# Patient Record
Sex: Female | Born: 1970 | Race: Black or African American | Hispanic: No | Marital: Married | Smoking: Never smoker
Health system: Southern US, Community
[De-identification: ages and names within clinical notes are randomized; demographics above are authoritative.]

## PROBLEM LIST (undated history)

## (undated) DIAGNOSIS — D0510 Intraductal carcinoma in situ of unspecified breast: Secondary | ICD-10-CM

## (undated) DIAGNOSIS — H269 Unspecified cataract: Secondary | ICD-10-CM

## (undated) DIAGNOSIS — D649 Anemia, unspecified: Secondary | ICD-10-CM

## (undated) DIAGNOSIS — Z8489 Family history of other specified conditions: Secondary | ICD-10-CM

## (undated) DIAGNOSIS — Z961 Presence of intraocular lens: Secondary | ICD-10-CM

## (undated) DIAGNOSIS — Q150 Congenital glaucoma: Secondary | ICD-10-CM

## (undated) HISTORY — DX: Unspecified cataract: H26.9

## (undated) HISTORY — DX: Congenital glaucoma: Q15.0

## (undated) HISTORY — PX: EYE SURGERY: SHX253

## (undated) HISTORY — DX: Intraductal carcinoma in situ of unspecified breast: D05.10

## (undated) HISTORY — DX: Presence of intraocular lens: Z96.1

---

## 2005-05-30 ENCOUNTER — Encounter: Admission: RE | Admit: 2005-05-30 | Discharge: 2005-05-30 | Payer: Self-pay | Admitting: Unknown Physician Specialty

## 2007-05-15 ENCOUNTER — Inpatient Hospital Stay: Payer: Self-pay | Admitting: Internal Medicine

## 2007-05-15 ENCOUNTER — Other Ambulatory Visit: Payer: Self-pay

## 2007-05-16 ENCOUNTER — Other Ambulatory Visit: Payer: Self-pay

## 2008-09-30 ENCOUNTER — Emergency Department: Payer: Self-pay | Admitting: Internal Medicine

## 2014-03-20 ENCOUNTER — Other Ambulatory Visit: Payer: Self-pay | Admitting: Family Medicine

## 2014-03-20 DIAGNOSIS — N939 Abnormal uterine and vaginal bleeding, unspecified: Secondary | ICD-10-CM

## 2014-03-20 DIAGNOSIS — D509 Iron deficiency anemia, unspecified: Secondary | ICD-10-CM

## 2014-03-20 DIAGNOSIS — Z86018 Personal history of other benign neoplasm: Secondary | ICD-10-CM

## 2014-03-24 ENCOUNTER — Ambulatory Visit
Admission: RE | Admit: 2014-03-24 | Discharge: 2014-03-24 | Disposition: A | Discharge status: Home / Self Care | Payer: Managed Care, Other (non HMO) | Source: Ambulatory Visit | Attending: Family Medicine | Admitting: Family Medicine

## 2014-03-24 DIAGNOSIS — Z86018 Personal history of other benign neoplasm: Secondary | ICD-10-CM

## 2014-03-24 DIAGNOSIS — N939 Abnormal uterine and vaginal bleeding, unspecified: Secondary | ICD-10-CM

## 2014-03-24 DIAGNOSIS — D509 Iron deficiency anemia, unspecified: Secondary | ICD-10-CM

## 2015-09-28 ENCOUNTER — Other Ambulatory Visit: Payer: Self-pay | Admitting: Family Medicine

## 2015-09-28 DIAGNOSIS — D259 Leiomyoma of uterus, unspecified: Secondary | ICD-10-CM

## 2015-10-01 ENCOUNTER — Other Ambulatory Visit: Payer: Managed Care, Other (non HMO)

## 2015-10-11 ENCOUNTER — Ambulatory Visit
Admission: RE | Admit: 2015-10-11 | Discharge: 2015-10-11 | Disposition: A | Discharge status: Home / Self Care | Payer: Managed Care, Other (non HMO) | Source: Ambulatory Visit | Attending: Family Medicine | Admitting: Family Medicine

## 2015-10-11 DIAGNOSIS — D259 Leiomyoma of uterus, unspecified: Secondary | ICD-10-CM

## 2016-01-03 ENCOUNTER — Ambulatory Visit
Admission: RE | Admit: 2016-01-03 | Payer: Managed Care, Other (non HMO) | Source: Ambulatory Visit | Admitting: Ophthalmology

## 2016-01-03 ENCOUNTER — Encounter: Admission: RE | Payer: Self-pay | Source: Ambulatory Visit

## 2016-01-03 SURGERY — PHOTOCOAGULATION, EYE, USING LASER
Anesthesia: Regional | Laterality: Right

## 2016-03-27 ENCOUNTER — Other Ambulatory Visit: Payer: Self-pay | Admitting: Family Medicine

## 2016-03-27 DIAGNOSIS — Z1231 Encounter for screening mammogram for malignant neoplasm of breast: Secondary | ICD-10-CM

## 2016-04-04 ENCOUNTER — Ambulatory Visit: Payer: Managed Care, Other (non HMO)

## 2016-04-21 ENCOUNTER — Ambulatory Visit
Admission: RE | Admit: 2016-04-21 | Discharge: 2016-04-21 | Disposition: A | Discharge status: Home / Self Care | Payer: Managed Care, Other (non HMO) | Source: Ambulatory Visit | Attending: Family Medicine | Admitting: Family Medicine

## 2016-04-21 DIAGNOSIS — Z1231 Encounter for screening mammogram for malignant neoplasm of breast: Secondary | ICD-10-CM

## 2016-06-05 ENCOUNTER — Encounter (HOSPITAL_COMMUNITY): Payer: Self-pay | Admitting: Emergency Medicine

## 2016-06-05 ENCOUNTER — Emergency Department (HOSPITAL_COMMUNITY)
Admission: EM | Admit: 2016-06-05 | Discharge: 2016-06-05 | Disposition: A | Discharge status: Home / Self Care | Payer: Managed Care, Other (non HMO) | Attending: Physician Assistant | Admitting: Physician Assistant

## 2016-06-05 ENCOUNTER — Emergency Department (HOSPITAL_COMMUNITY): Payer: Managed Care, Other (non HMO)

## 2016-06-05 DIAGNOSIS — R079 Chest pain, unspecified: Secondary | ICD-10-CM | POA: Diagnosis not present

## 2016-06-05 DIAGNOSIS — Z91040 Latex allergy status: Secondary | ICD-10-CM | POA: Insufficient documentation

## 2016-06-05 LAB — BASIC METABOLIC PANEL
Anion gap: 6 (ref 5–15)
BUN: 10 mg/dL (ref 6–20)
CO2: 26 mmol/L (ref 22–32)
Calcium: 9.5 mg/dL (ref 8.9–10.3)
Chloride: 106 mmol/L (ref 101–111)
Creatinine, Ser: 0.69 mg/dL (ref 0.44–1.00)
GFR calc Af Amer: >60 mL/min (ref >60)
GFR calc non Af Amer: >60 mL/min (ref >60)
Glucose, Bld: 86 mg/dL (ref 65–99)
Potassium: 3.9 mmol/L (ref 3.5–5.1)
Sodium: 138 mmol/L (ref 135–145)

## 2016-06-05 LAB — I-STAT TROPONIN, ED
Troponin i, poc: 0 ng/mL (ref 0.00–0.08)
Troponin i, poc: 0 ng/mL (ref 0.00–0.08)

## 2016-06-05 LAB — CBC
HCT: 39.9 % (ref 36.0–46.0)
Hemoglobin: 12.7 g/dL (ref 12.0–15.0)
MCH: 25.7 pg — ABNORMAL LOW (ref 26.0–34.0)
MCHC: 31.8 g/dL (ref 30.0–36.0)
MCV: 80.6 fL (ref 78.0–100.0)
Platelets: 284 10*3/uL (ref 150–400)
RBC: 4.95 MIL/uL (ref 3.87–5.11)
RDW: 15.6 % — ABNORMAL HIGH (ref 11.5–15.5)
WBC: 5.7 10*3/uL (ref 4.0–10.5)

## 2016-06-05 LAB — I-STAT BETA HCG BLOOD, ED (MC, WL, AP ONLY): I-stat hCG, quantitative: <5 m[IU]/mL (ref <5)

## 2016-06-05 MED ORDER — GI COCKTAIL ~~LOC~~
30.0000 mL | Freq: Once | ORAL | Status: AC
  Administered 2016-06-05: 30 mL via ORAL
  Filled 2016-06-05: qty 30

## 2016-06-05 MED ORDER — ASPIRIN 81 MG PO CHEW
324.0000 mg | CHEWABLE_TABLET | Freq: Once | ORAL | Status: AC
  Administered 2016-06-05: 324 mg via ORAL
  Filled 2016-06-05: qty 4

## 2016-06-05 NOTE — ED Notes (Signed)
Upon introducing self to pt EKG in process by Covil NT.

## 2016-06-05 NOTE — ED Triage Notes (Signed)
Pt c/o sudden onset R chest pain that radiates down R arm since this morning. Pt also c/o associated nausea with the pain. 7/10 pain at this time. Pt c/o intermittent SOB, denies dizziness. Denies leg swelling. Pt sts she has been cleaning a lot and thinks she may have overworked herself. Denies worsening of pain with movement.

## 2016-06-05 NOTE — Discharge Instructions (Signed)
Please follow up with her primary care physician. We did tests an x-ray which did not show any cause for your pain. It may due to the diet changes. Please pay close attention return with any changes.

## 2016-06-05 NOTE — ED Provider Notes (Signed)
[place] DEPT Provider Note   CSN: [id number] Arrival date & time: 06/05/16  1020     History   Chief Complaint Chief Complaint  Patient presents with  . Chest Pain  . Nausea    HPI Brandi Fox is a 45 y.o. female.  The history is provided by the patient.  Chest Pain   This is a new problem. The current episode started 1 to 2 hours ago. The problem occurs constantly. The problem has not changed since onset.The pain is associated with lifting, raising an arm and movement. The pain is present in the lateral region. The pain is at a severity of 3/10. The pain is mild. The quality of the pain is described as sharp. The pain radiates to the right shoulder and right arm. Associated symptoms include shortness of breath. Pertinent negatives include no abdominal pain, no dizziness, no fever and no weakness. She has tried nothing for the symptoms. The treatment provided no relief. There are no known risk factors.  Pertinent negatives for past medical history include no COPD, no hyperlipidemia, no hypertension and no PE.  Pertinent negatives for family medical history include: no aortic dissection, no PE and no PVD.  Procedure history is negative for cardiac catheterization and echocardiogram.       History reviewed. No pertinent past medical history.  There are no active problems to display for this patient.   Past Surgical History:  Procedure Laterality Date  . EYE SURGERY      OB History    No data available       Home Medications    Prior to Admission medications   Medication Sig Start Date End Date Taking? Authorizing Provider  brimonidine (ALPHAGAN) 0.2 % ophthalmic solution Place 1 drop into both eyes 2 (two) times daily.   Yes Historical Provider, MD  Chaste Tree (VITEX EXTRACT PO) Take 1 tablet by mouth daily.   Yes Historical Provider, MD  latanoprost (XALATAN) 0.005 % ophthalmic solution Place 1 drop into both eyes at bedtime.   Yes Historical Provider,  MD  Multiple Vitamin (MULTIVITAMIN WITH MINERALS) TABS tablet Take 1 tablet by mouth daily.   Yes Historical Provider, MD  timolol (TIMOPTIC) 0.5 % ophthalmic solution Place 1 drop into both eyes 2 (two) times daily.   Yes Historical Provider, MD    Family History No family history on file.  Social History Social History  Substance Use Topics  . Smoking status: Never Smoker  . Smokeless tobacco: Never Used  . Alcohol use No     Allergies   Amoxicillin; Latex; and Penicillins   Review of Systems Review of Systems  Constitutional: Negative for activity change, fatigue and fever.  Respiratory: Positive for shortness of breath.   Cardiovascular: Positive for chest pain.  Gastrointestinal: Negative for abdominal pain.  Neurological: Negative for dizziness and weakness.     Physical Exam Updated Vital Signs BP 117/70   Pulse 60   Temp 97.9 F (36.6 C) (Oral)   Resp 16   LMP 04/05/2016   SpO2 99%   Physical Exam  Constitutional: She is oriented to person, place, and time. She appears well-developed and well-nourished.  HENT:  Head: Normocephalic and atraumatic.  Eyes: Conjunctivae are normal. Right eye exhibits no discharge.  Neck: Neck supple.  Cardiovascular: Normal rate, regular rhythm and normal heart sounds.   No murmur heard. Pulmonary/Chest: Effort normal and breath sounds normal. She has no wheezes. She has no rales.  Abdominal: Soft. She exhibits no  distension. There is no tenderness.  Musculoskeletal: Normal range of motion. She exhibits no edema.  R arm pain  Neurological: She is oriented to person, place, and time. No cranial nerve deficit.  Skin: Skin is warm and dry. No rash noted. She is not diaphoretic.  Psychiatric: She has a normal mood and affect. Her behavior is normal.  Nursing note and vitals reviewed.    ED Treatments / Results  Labs (all labs ordered are listed, but only abnormal results are displayed) Labs Reviewed  CBC - Abnormal;  Notable for the following:       Result Value   MCH 25.7 (*)    RDW 15.6 (*)    All other components within normal limits  BASIC METABOLIC PANEL  I-STAT TROPOININ, ED  I-STAT BETA HCG BLOOD, ED ([place], [facility], [facility] ONLY)  I-STAT TROPOININ, ED  I-STAT TROPOININ, ED    EKG  EKG Interpretation  Date/Time:  Monday June 05 2016 10:36:45 EST Ventricular Rate:  60 PR Interval:    QRS Duration: 81 QT Interval:  409 QTC Calculation: 409 R Axis:   66 Text Interpretation:  Sinus rhythm Consider left atrial enlargement Anteroseptal infarct, old No significant change since last tracing Confirmed by Gerald Leitz ([id number]) on 06/05/2016 2:18:19 PM       Radiology Dg Chest 2 View  Result Date: 06/05/2016 CLINICAL DATA:  Chest pain.  Shortness of breath EXAM: CHEST  2 VIEW COMPARISON:  05/15/2007 . FINDINGS: Mediastinum and hilar structures normal. Low lung volumes with mild right base subsegmental atelectasis. No pleural effusion or pneumothorax. Heart size stable. No acute bony abnormality identified. IMPRESSION: Low lung volumes mild right base subsegmental atelectasis. Electronically Signed   By: Marcello Moores  Register   On: 06/05/2016 11:13    Procedures Procedures (including critical care time)  Medications Ordered in ED Medications  aspirin chewable tablet 324 mg (324 mg Oral Given 06/05/16 1055)  gi cocktail (Maalox,Lidocaine,Donnatal) (30 mLs Oral Given 06/05/16 1055)     Initial Impression / Assessment and Plan / ED Course  I have reviewed the triage vital signs and the nursing notes.  Pertinent labs & imaging results that were available during my care of the patient were reviewed by me and considered in my medical decision making (see chart for details).  Clinical Course     Patient is a 45 year old female presenting with chest pain radiating to the right arm. Patient was cleaning very heavily this weekend and feels like it may be secondary to that. However she developed mild nausea  and radiation to the right arm. She googled her symptoms and was concerned about cardiac ideology of chest pain. Patient does remark report mild shortness of breath, but she is says that she is worried that she may just be convincing herself she had this.  Patient has noted recent change in diet which she is worried might of caused her epigastric pain today.  Patient has normal vital signs. No history of hypertension hyperlipidemia or cardiac disease. No family history of early cardiac deaths.  We will do delta troponin.  2:18 PM Repeat trop negative.    Will have her follow up with PCP>  Final Clinical Impressions(s) / ED Diagnoses   Final diagnoses:  None    New Prescriptions New Prescriptions   No medications on file     Pernell Lenoir Julio Alm, MD 06/05/16 1418

## 2017-05-23 ENCOUNTER — Other Ambulatory Visit: Payer: Self-pay | Admitting: Family Medicine

## 2017-05-23 DIAGNOSIS — Z1231 Encounter for screening mammogram for malignant neoplasm of breast: Secondary | ICD-10-CM

## 2018-07-09 ENCOUNTER — Ambulatory Visit: Payer: Managed Care, Other (non HMO)

## 2018-07-10 ENCOUNTER — Ambulatory Visit
Admission: RE | Admit: 2018-07-10 | Discharge: 2018-07-10 | Disposition: A | Discharge status: Home / Self Care | Payer: Managed Care, Other (non HMO) | Source: Ambulatory Visit | Attending: Family Medicine | Admitting: Family Medicine

## 2018-07-10 DIAGNOSIS — Z1231 Encounter for screening mammogram for malignant neoplasm of breast: Secondary | ICD-10-CM

## 2018-08-20 IMAGING — MG DIGITAL SCREENING BILATERAL MAMMOGRAM WITH CAD
4 series · 4 of 4 positions shown · non-contrast
Comparison: None.

CLINICAL DATA: Screening.

EXAM:
DIGITAL SCREENING BILATERAL MAMMOGRAM WITH CAD

[L CC]
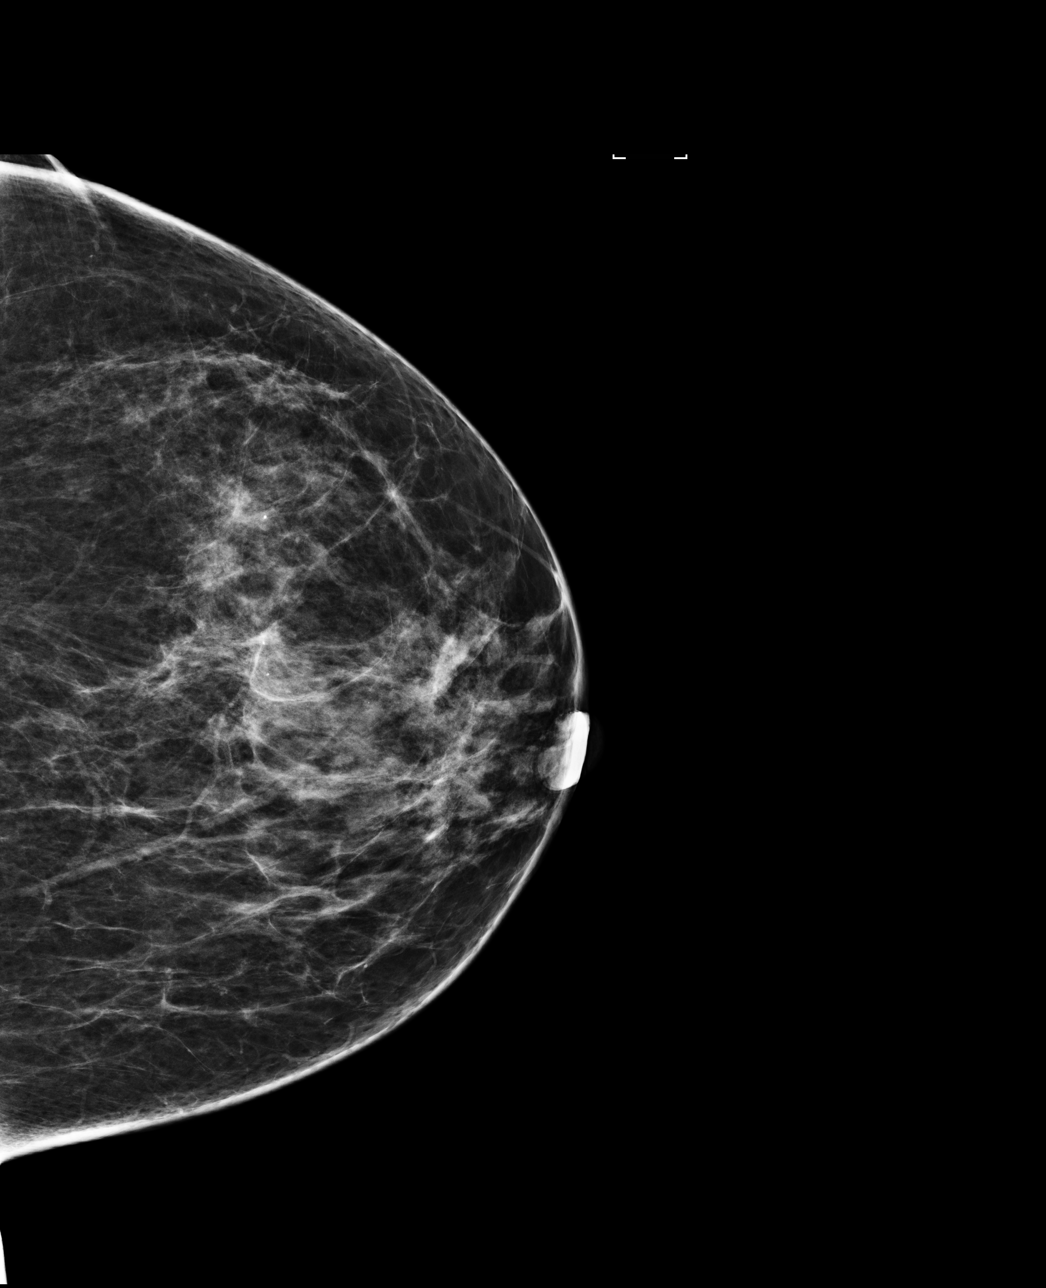

[L MLO]
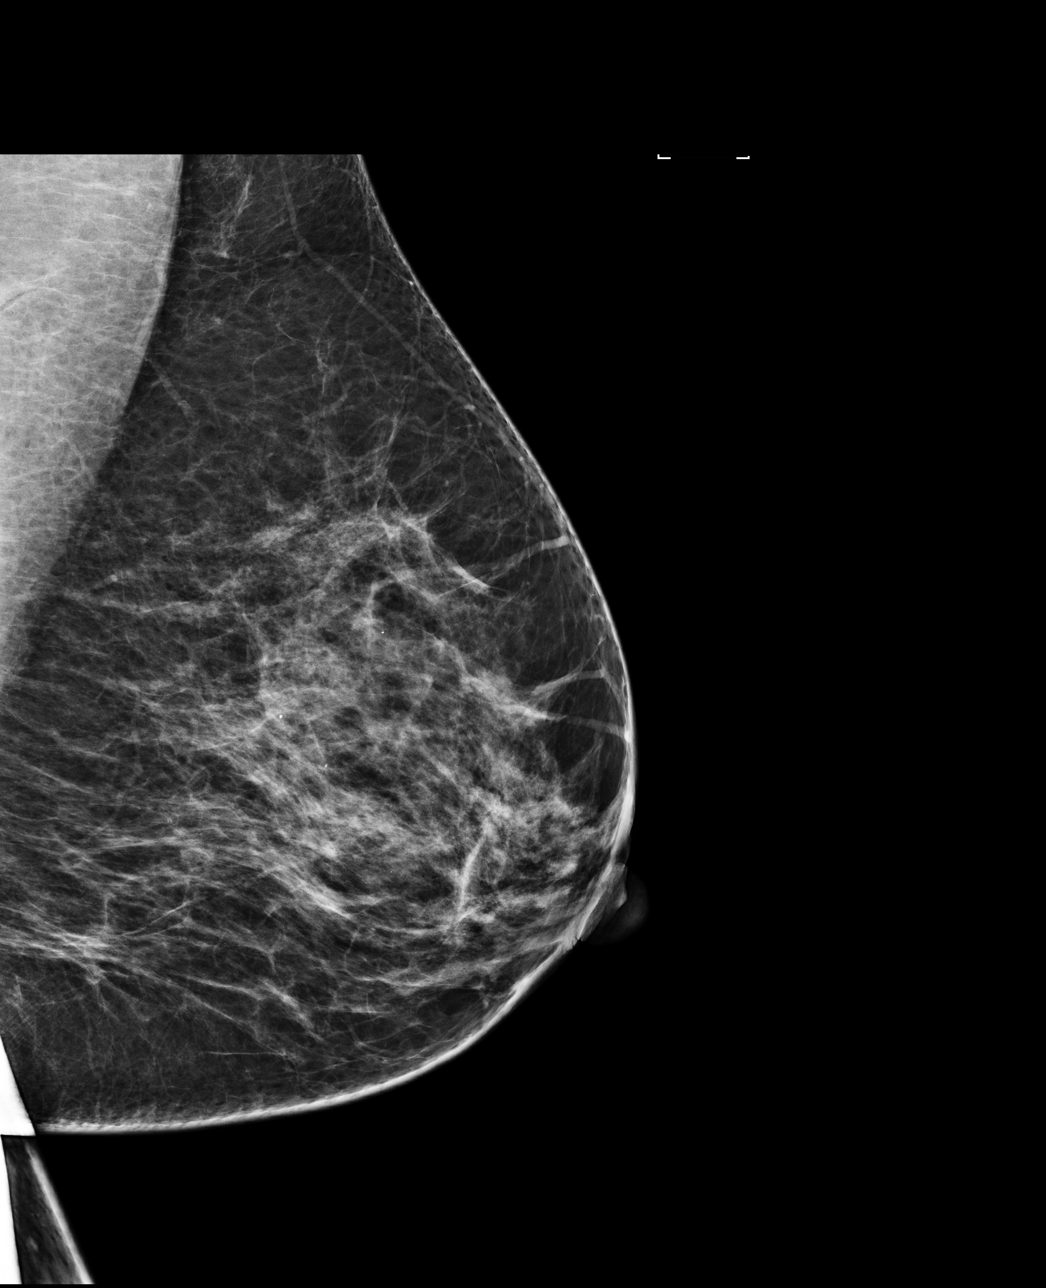

[R MLO]
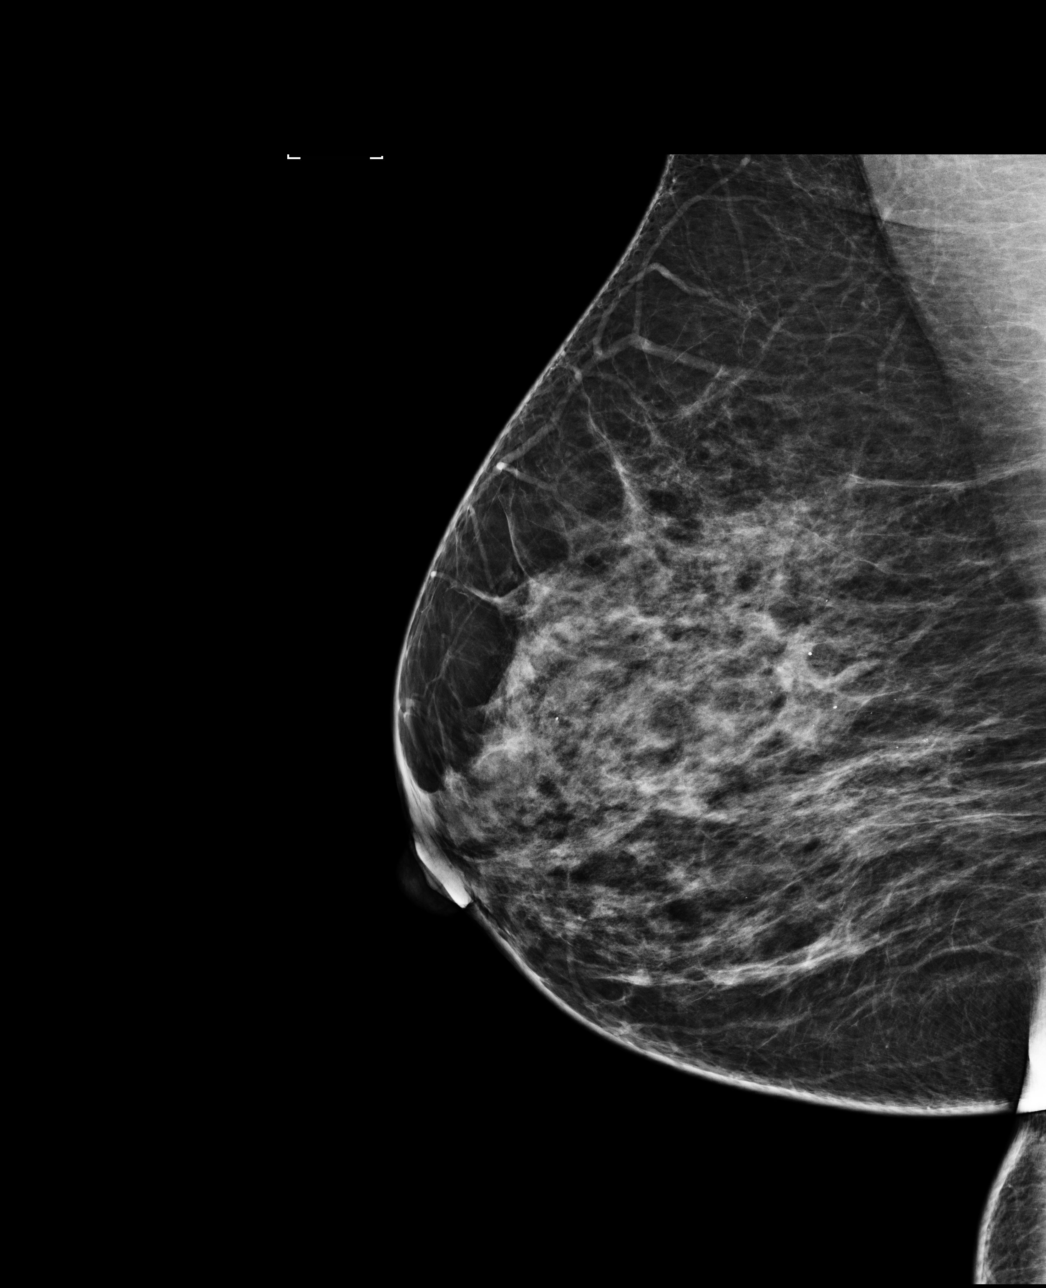

[R CC]
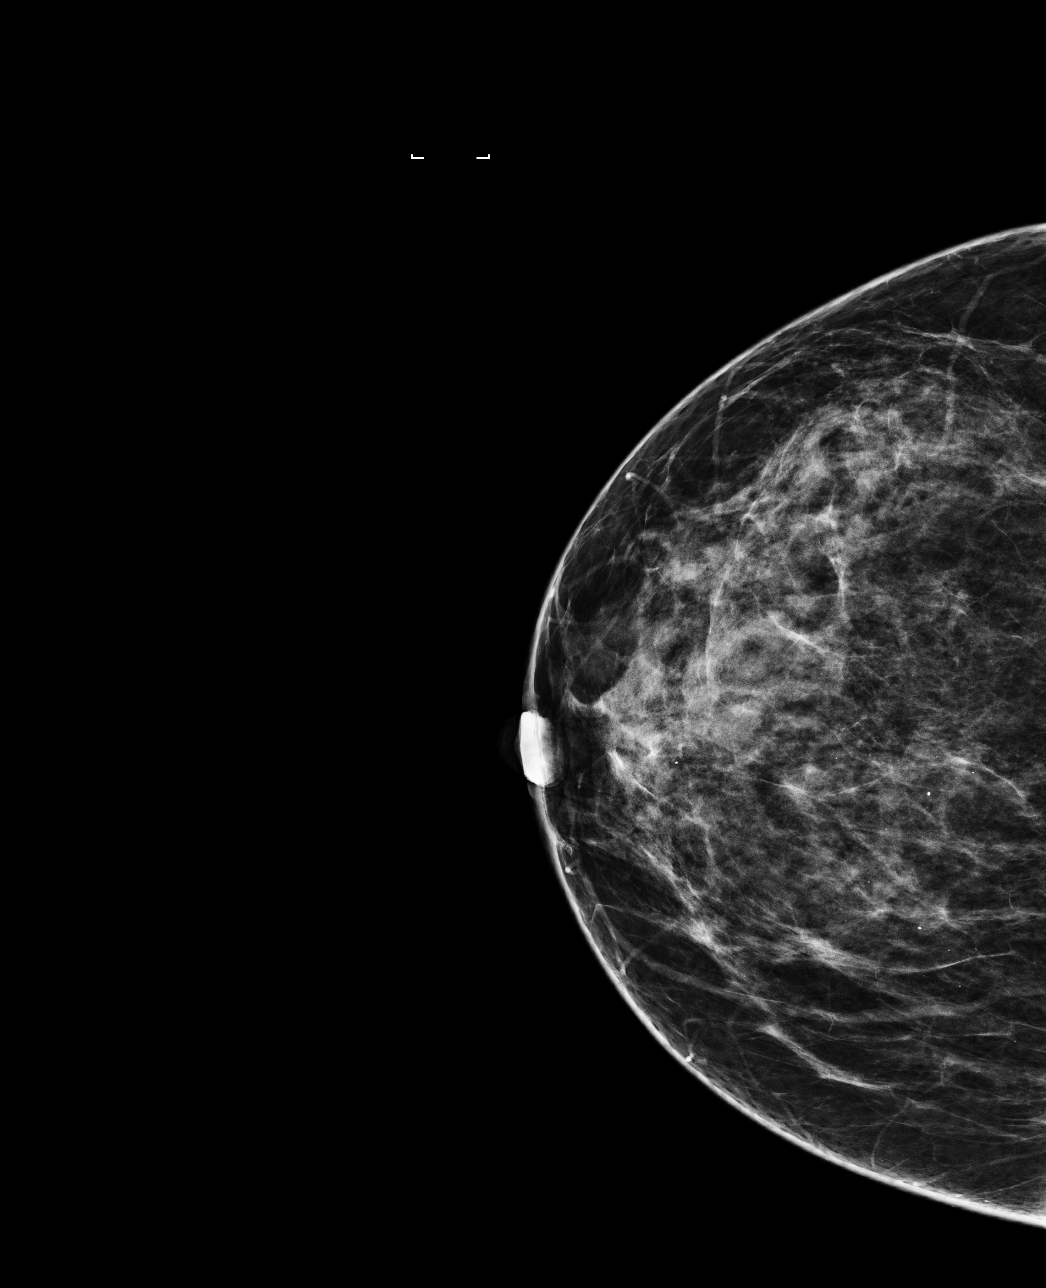

[4 of 4 positions shown; findings below may reference images not displayed]

ACR Breast Density Category c: The breast tissue is heterogeneously
dense, which may obscure small masses
FINDINGS: There are no findings suspicious for malignancy. Images were
processed with CAD.
IMPRESSION: No mammographic evidence of malignancy. A result letter of this
screening mammogram will be mailed directly to the patient.

RECOMMENDATION:
Screening mammogram in one year. (Code:[id number])

BI-RADS CATEGORY  1: Negative.

## 2019-08-14 ENCOUNTER — Other Ambulatory Visit: Payer: Managed Care, Other (non HMO)

## 2019-08-25 ENCOUNTER — Other Ambulatory Visit: Payer: Managed Care, Other (non HMO)

## 2019-09-01 ENCOUNTER — Encounter: Payer: Self-pay | Admitting: Obstetrics & Gynecology

## 2019-09-01 ENCOUNTER — Other Ambulatory Visit: Payer: Managed Care, Other (non HMO)

## 2019-09-05 ENCOUNTER — Encounter: Payer: Self-pay | Admitting: Obstetrics & Gynecology

## 2019-10-13 ENCOUNTER — Encounter: Payer: Self-pay | Admitting: Obstetrics & Gynecology

## 2020-05-12 ENCOUNTER — Other Ambulatory Visit: Payer: Self-pay | Admitting: Family Medicine

## 2020-05-12 DIAGNOSIS — Z1231 Encounter for screening mammogram for malignant neoplasm of breast: Secondary | ICD-10-CM

## 2020-07-08 NOTE — Telephone Encounter (Signed)
Please advise 

## 2020-07-09 NOTE — Telephone Encounter (Signed)
Pt calling; is requesting an u/s to be done before her appt c PH on the 14th so they can discuss size and location of the fibroids, next steps; is suffering greatly c the fibroids.  Is aware PH is out of the office.  [phone]  Adv pt she is a new pt; PH may need to see her first before ordering u/s; pt states he has seen her before; adv if not seen in three yrs or more she is considered a new pt.  Adv pt I will send msg to [facility]; if he doesn't get to msgs before Monday he will get to it Monday.  Pt very appreciative of call.

## 2020-07-12 NOTE — Telephone Encounter (Signed)
Patient is scheduled for 07/13/20 at 1:30

## 2020-07-13 ENCOUNTER — Other Ambulatory Visit: Payer: Self-pay | Admitting: Obstetrics & Gynecology

## 2020-07-13 ENCOUNTER — Other Ambulatory Visit (HOSPITAL_COMMUNITY)
Admission: RE | Admit: 2020-07-13 | Discharge: 2020-07-13 | Disposition: A | Discharge status: Home / Self Care | Payer: Managed Care, Other (non HMO) | Source: Ambulatory Visit | Attending: Obstetrics & Gynecology | Admitting: Obstetrics & Gynecology

## 2020-07-13 ENCOUNTER — Encounter: Payer: Self-pay | Admitting: Obstetrics & Gynecology

## 2020-07-13 ENCOUNTER — Ambulatory Visit (INDEPENDENT_AMBULATORY_CARE_PROVIDER_SITE_OTHER): Payer: Managed Care, Other (non HMO)

## 2020-07-13 ENCOUNTER — Other Ambulatory Visit: Payer: Self-pay

## 2020-07-13 ENCOUNTER — Ambulatory Visit: Payer: Managed Care, Other (non HMO) | Admitting: Obstetrics & Gynecology

## 2020-07-13 VITALS — BP 120/80 | Ht 63.0 in | Wt 199.0 lb

## 2020-07-13 DIAGNOSIS — D252 Subserosal leiomyoma of uterus: Secondary | ICD-10-CM | POA: Diagnosis not present

## 2020-07-13 DIAGNOSIS — D219 Benign neoplasm of connective and other soft tissue, unspecified: Secondary | ICD-10-CM

## 2020-07-13 DIAGNOSIS — N921 Excessive and frequent menstruation with irregular cycle: Secondary | ICD-10-CM

## 2020-07-13 DIAGNOSIS — Z124 Encounter for screening for malignant neoplasm of cervix: Secondary | ICD-10-CM

## 2020-07-13 DIAGNOSIS — D251 Intramural leiomyoma of uterus: Secondary | ICD-10-CM | POA: Diagnosis not present

## 2020-07-13 NOTE — Progress Notes (Signed)
Uterine Fibroids Patient is a 49 yo G62P1 AA F who presents with past history of uterine fibroids and concerns for worsening symptoms related to them.. Periods are irregular for the last 1-2 years but still occurring every 3-6 weeks, lasting 12 or more days. Dysmenorrhea:moderate, occurring throughout menses. Cyclic symptoms include bloating, pelvic pain and fatigue. No intermenstrual bleeding, spotting, or discharge.   PMHx: She  has no past medical history on file. Also,  has a past surgical history that includes Eye surgery., family history is not on file.,  reports that she has never smoked. She has never used smokeless tobacco. She reports that she does not drink alcohol and does not use drugs.  She has a current medication list which includes the following prescription(s): brimonidine, chaste tree, latanoprost, multivitamin with minerals, and timolol. Also, is allergic to amoxicillin, latex, and penicillins.  Review of Systems  Constitutional: Positive for malaise/fatigue. Negative for chills and fever.  HENT: Negative for congestion, sinus pain and sore throat.   Eyes: Negative for blurred vision and pain.  Respiratory: Negative for cough and wheezing.   Cardiovascular: Negative for chest pain and leg swelling.  Gastrointestinal: Positive for abdominal pain. Negative for constipation, diarrhea, heartburn, nausea and vomiting.  Genitourinary: Negative for dysuria, frequency, hematuria and urgency.  Musculoskeletal: Negative for back pain, joint pain, myalgias and neck pain.  Skin: Negative for itching and rash.  Neurological: Negative for dizziness, tremors and weakness.  Endo/Heme/Allergies: Does not bruise/bleed easily.  Psychiatric/Behavioral: Negative for depression. The patient is not nervous/anxious and does not have insomnia.     Objective: BP 120/80   Ht 5\' 3"  (1.6 m)   Wt 199 lb (90.3 kg)   BMI 35.25 kg/m  Physical Exam Constitutional:      General: She is not in acute  distress.    Appearance: She is well-developed.  Genitourinary:     Vagina normal.     No vaginal erythema or bleeding.      Right Adnexa: not tender and no mass present.    Left Adnexa: not tender and no mass present.    No cervical motion tenderness, discharge, polyp or nabothian cyst.     Uterus is enlarged and mobile.     Uterus is not tender.     Uterine mass present.    Uterus exam comments: 12 week size.     Uterus is midaxial and anteverted.     Pelvic exam was performed with patient supine.  HENT:     Head: Normocephalic and atraumatic.     Nose: Nose normal.  Abdominal:     General: There is no distension.     Palpations: Abdomen is soft.     Tenderness: There is no abdominal tenderness.  Musculoskeletal:        General: Normal range of motion.  Neurological:     Mental Status: She is alert and oriented to person, place, and time.     Cranial Nerves: No cranial nerve deficit.  Skin:    General: Skin is warm and dry.  Psychiatric:        Attention and Perception: Attention normal.        Mood and Affect: Mood and affect normal.        Speech: Speech normal.        Behavior: Behavior normal.        Thought Content: Thought content normal.        Judgment: Judgment normal.    [place] TODAY The uterus  is anteverted and measures 13.1 x 8.1 x 7.7 cm. Echo texture is heterogenous with evidence of focal masses. Within the uterus are multiple suspected fibroids measuring: Fibroid 1:41.3 x 37.5 x 37.1 mm intramural vs. less than 25% submucosal, anterior Fibroid 2:18.6 x 11.8 x 17.8 mm intramural posteiror Fibroid 3: 26.3 x 17.8 x 24.8 mm subserosal left, lower uterine segment Fibroid 4: 12.3 x 9.0 x 17.4 mm intramural posterior, lower uterine segment The Endometrium measures 10.4 mm.  This represents and increase from 2017 [place] sized fibroids  ASSESSMENT/PLAN:    Problem List Items Addressed This Visit      Genitourinary   Intramural and subserous leiomyoma of uterus -  Primary     Other   Menorrhagia with irregular cycle    Other Visit Diagnoses    Screening for cervical cancer       Relevant Orders   Cytology - PAP    Fibroid treatment such as [place], Lupron, Myomectomy, and Hysterectomy discussed in detail, with the pros and cons of each choice counseled.  No treatment as an option also discussed, as well as control of symptoms alone with hormone therapy. Information provided to the patient.  To consider her options for now No interested in fertility No s/sx menopause at this time.    Barnett Applebaum, MD, [facility], [facility] 07/13/2020  2:34 PM

## 2020-07-13 NOTE — Patient Instructions (Signed)
Total Laparoscopic Hysterectomy A total laparoscopic hysterectomy is a minimally invasive surgery to remove the uterus and cervix. The fallopian tubes and ovaries can also be removed (bilateral salpingo-oophorectomy) during this surgery, if necessary. This procedure may be done to treat problems such as:  Noncancerous growths in the uterus (uterine fibroids) that cause symptoms.  A condition that causes the lining of the uterus (endometrium) to grow in other areas (endometriosis).  Problems with pelvic support. This is caused by weakened muscles of the pelvis following vaginal childbirth or menopause.  Cancer of the cervix, ovaries, uterus, or endometrium.  Excessive (dysfunctional) uterine bleeding. This surgery is performed by inserting a thin, lighted tube (laparoscope) and surgical instruments into small incisions in the abdomen. The laparoscope sends images to a monitor. The images help the health care provider perform the procedure. After this procedure, you will no longer be able to have a baby, and you will no longer have a menstrual period. Tell a health care provider about:  Any allergies you have.  All medicines you are taking, including vitamins, herbs, eye drops, creams, and over-the-counter medicines.  Any problems you or family members have had with anesthetic medicines.  Any blood disorders you have.  Any surgeries you have had.  Any medical conditions you have.  Whether you are pregnant or may be pregnant. What are the risks? Generally, this is a safe procedure. However, problems may occur, including:  Infection.  Bleeding.  Blood clots in the legs or lungs.  Allergic reactions to medicines.  Damage to other structures or organs.  The risk that the surgery may have to be switched to the regular one in which a large incision is made in the abdomen (abdominal hysterectomy). What happens before the procedure? Staying hydrated Follow instructions from your  health care provider about hydration, which may include:  Up to 2 hours before the procedure - you may continue to drink clear liquids, such as water, clear fruit juice, black coffee, and plain tea Eating and drinking restrictions Follow instructions from your health care provider about eating and drinking, which may include:  8 hours before the procedure - stop eating heavy meals or foods such as meat, fried foods, or fatty foods.  6 hours before the procedure - stop eating light meals or foods, such as toast or cereal.  6 hours before the procedure - stop drinking milk or drinks that contain milk.  2 hours before the procedure - stop drinking clear liquids. Medicines  Ask your health care provider about: ? Changing or stopping your regular medicines. This is especially important if you are taking diabetes medicines or blood thinners. ? Taking over-the-counter medicines, vitamins, herbs, and supplements. ? Taking medicines such as aspirin and ibuprofen. These medicines can thin your blood. Do not take these medicines unless your health care provider tells you to take them.  You may be given antibiotic medicine to help prevent infection.  You may be asked to take laxatives.  You may be given medicines to help prevent nausea and vomiting after the procedure. General instructions  Ask your health care provider how your surgical site will be marked or identified.  You may be asked to shower with a germ-killing soap.  Do not use any products that contain nicotine or tobacco, such as cigarettes and e-cigarettes. If you need help quitting, ask your health care provider.  You may have an exam or testing, such as an ultrasound to determine the size and shape of your pelvic organs.    You may have a blood or urine sample taken.  This procedure can affect the way you feel about yourself. Talk with your health care provider about the physical and emotional changes hysterectomy may  cause.  Plan to have someone take you home from the hospital or clinic.  Plan to have a responsible adult care for you for at least 24 hours after you leave the hospital or clinic. This is important. What happens during the procedure?  To lower your risk of infection: ? Your health care team will wash or sanitize their hands. ? Your skin will be washed with soap. ? Hair may be removed from the surgical area.  An IV will be inserted into one of your veins.  You will be given one or more of the following: ? A medicine to help you relax (sedative). ? A medicine to make you fall asleep (general anesthetic).  You will be given antibiotic medicine through your IV.  A tube may be inserted down your throat to help you breathe during the procedure.  A gas (carbon dioxide) will be used to inflate your abdomen to allow your surgeon to see inside of your abdomen.  Three or four small incisions will be made in your abdomen.  A laparoscope will be inserted into one of your incisions. Surgical instruments will be inserted through the other incisions in order to perform the procedure.  Your uterus and cervix may be removed through your vagina or cut into small pieces and removed through the small incisions. Any other organs that need to be removed will also be removed this way.  Carbon dioxide will be released from inside of your abdomen.  Your incisions will be closed with stitches (sutures).  A bandage (dressing) may be placed over your incisions. The procedure may vary among health care providers and hospitals. What happens after the procedure?  Your blood pressure, heart rate, breathing rate, and blood oxygen level will be monitored until the medicines you were given have worn off.  You will be given medicine for pain and nausea as needed.  Do not drive for 24 hours if you received a sedative. Summary  Total Laparoscopic hysterectomy is a procedure to remove your uterus, cervix and  sometimes the fallopian tubes and ovaries.  This procedure can affect the way you feel about yourself. Talk with your health care provider about the physical and emotional changes hysterectomy may cause.  After this procedure, you will no longer be able to have a baby, and you will no longer have a menstrual period.  You will be given pain medicine to control discomfort after this procedure. This information is not intended to replace advice given to you by your health care provider. Make sure you discuss any questions you have with your health care provider. Document Revised: 06/29/2017 Document Reviewed: 09/27/2016 Elsevier Patient Education  2020 [employer].

## 2020-07-15 LAB — CYTOLOGY - PAP: Diagnosis: NEGATIVE

## 2020-12-30 ENCOUNTER — Other Ambulatory Visit: Payer: Self-pay

## 2020-12-30 ENCOUNTER — Ambulatory Visit
Admission: RE | Admit: 2020-12-30 | Discharge: 2020-12-30 | Disposition: A | Discharge status: Home / Self Care | Payer: Managed Care, Other (non HMO) | Source: Ambulatory Visit | Attending: Family Medicine | Admitting: Family Medicine

## 2020-12-30 DIAGNOSIS — Z1231 Encounter for screening mammogram for malignant neoplasm of breast: Secondary | ICD-10-CM | POA: Insufficient documentation

## 2021-01-04 ENCOUNTER — Other Ambulatory Visit: Payer: Self-pay | Admitting: Family Medicine

## 2021-01-05 ENCOUNTER — Other Ambulatory Visit: Payer: Self-pay | Admitting: Family Medicine

## 2021-01-05 DIAGNOSIS — R921 Mammographic calcification found on diagnostic imaging of breast: Secondary | ICD-10-CM

## 2021-01-05 DIAGNOSIS — R928 Other abnormal and inconclusive findings on diagnostic imaging of breast: Secondary | ICD-10-CM

## 2021-01-11 ENCOUNTER — Ambulatory Visit: Admission: RE | Admit: 2021-01-11 | Payer: Managed Care, Other (non HMO) | Source: Ambulatory Visit

## 2021-01-25 ENCOUNTER — Ambulatory Visit: Payer: Managed Care, Other (non HMO) | Admitting: Obstetrics and Gynecology

## 2021-02-02 ENCOUNTER — Ambulatory Visit
Admission: RE | Admit: 2021-02-02 | Discharge: 2021-02-02 | Disposition: A | Discharge status: Home / Self Care | Payer: Managed Care, Other (non HMO) | Source: Ambulatory Visit | Attending: Family Medicine | Admitting: Family Medicine

## 2021-02-02 ENCOUNTER — Other Ambulatory Visit: Payer: Self-pay

## 2021-02-02 DIAGNOSIS — R921 Mammographic calcification found on diagnostic imaging of breast: Secondary | ICD-10-CM | POA: Insufficient documentation

## 2021-02-02 DIAGNOSIS — R928 Other abnormal and inconclusive findings on diagnostic imaging of breast: Secondary | ICD-10-CM | POA: Insufficient documentation

## 2021-03-01 ENCOUNTER — Other Ambulatory Visit: Payer: Self-pay | Admitting: Family Medicine

## 2021-03-01 ENCOUNTER — Ambulatory Visit
Admission: RE | Admit: 2021-03-01 | Discharge: 2021-03-01 | Disposition: A | Discharge status: Home / Self Care | Payer: Managed Care, Other (non HMO) | Source: Ambulatory Visit | Attending: Family Medicine | Admitting: Family Medicine

## 2021-03-01 ENCOUNTER — Other Ambulatory Visit: Payer: Self-pay

## 2021-03-01 DIAGNOSIS — R928 Other abnormal and inconclusive findings on diagnostic imaging of breast: Secondary | ICD-10-CM

## 2021-03-01 DIAGNOSIS — R921 Mammographic calcification found on diagnostic imaging of breast: Secondary | ICD-10-CM | POA: Diagnosis present

## 2021-03-07 ENCOUNTER — Other Ambulatory Visit: Payer: Self-pay | Admitting: Family Medicine

## 2021-03-07 DIAGNOSIS — N6320 Unspecified lump in the left breast, unspecified quadrant: Secondary | ICD-10-CM

## 2021-03-07 DIAGNOSIS — R921 Mammographic calcification found on diagnostic imaging of breast: Secondary | ICD-10-CM

## 2021-03-07 DIAGNOSIS — R928 Other abnormal and inconclusive findings on diagnostic imaging of breast: Secondary | ICD-10-CM

## 2021-03-14 ENCOUNTER — Other Ambulatory Visit: Payer: Self-pay

## 2021-03-14 ENCOUNTER — Ambulatory Visit
Admission: RE | Admit: 2021-03-14 | Discharge: 2021-03-14 | Disposition: A | Discharge status: Home / Self Care | Payer: Managed Care, Other (non HMO) | Source: Ambulatory Visit | Attending: Family Medicine | Admitting: Family Medicine

## 2021-03-14 DIAGNOSIS — R921 Mammographic calcification found on diagnostic imaging of breast: Secondary | ICD-10-CM

## 2021-03-14 DIAGNOSIS — N6320 Unspecified lump in the left breast, unspecified quadrant: Secondary | ICD-10-CM

## 2021-03-14 DIAGNOSIS — R928 Other abnormal and inconclusive findings on diagnostic imaging of breast: Secondary | ICD-10-CM

## 2021-03-14 HISTORY — PX: BREAST CYST ASPIRATION: SHX578

## 2021-03-14 HISTORY — PX: BREAST BIOPSY: SHX20

## 2021-03-15 DIAGNOSIS — D0512 Intraductal carcinoma in situ of left breast: Secondary | ICD-10-CM

## 2021-03-15 LAB — SURGICAL PATHOLOGY

## 2021-03-15 NOTE — Progress Notes (Signed)
Navigation initiated.  Patient scheduled with Dr. Windell Moment 03/16/21, and Dr. Rogue Bussing 03/18/21 .  Notified patient of appointments.

## 2021-03-17 ENCOUNTER — Other Ambulatory Visit: Payer: Self-pay | Admitting: General Surgery

## 2021-03-17 DIAGNOSIS — D0512 Intraductal carcinoma in situ of left breast: Secondary | ICD-10-CM

## 2021-03-18 ENCOUNTER — Encounter: Payer: Self-pay | Admitting: *Deleted

## 2021-03-18 ENCOUNTER — Inpatient Hospital Stay: Payer: Managed Care, Other (non HMO) | Attending: Internal Medicine | Admitting: Internal Medicine

## 2021-03-18 ENCOUNTER — Inpatient Hospital Stay: Payer: Managed Care, Other (non HMO)

## 2021-03-18 DIAGNOSIS — Z803 Family history of malignant neoplasm of breast: Secondary | ICD-10-CM | POA: Diagnosis not present

## 2021-03-18 DIAGNOSIS — D0512 Intraductal carcinoma in situ of left breast: Secondary | ICD-10-CM | POA: Insufficient documentation

## 2021-03-18 HISTORY — DX: Intraductal carcinoma in situ of left breast: D05.12

## 2021-03-18 NOTE — Assessment & Plan Note (Addendum)
#  Left breast DCIS; high-grade with comedonecrosis; ER: Pending.  Agree with recommendation for MRI to further evaluate extent of DCIS.   # I had a long discussion with patient regarding pathology/natural history of DCIS.  Discussed that goal of DCIS is cure; and adjuvant therapies for prevention of new invasive/noninvasive malignancy.   #However the standard treatment-will be lumpectomy followed by adjuvant radiation.  I also discussed the rationale for sentinel lymph node biopsy given the high-grade DCIS/brain necrosis [and the fact that she could harbor-invasive cancer]. Also discussed regarding radiation therapy post lumpectomy.    # If the patient is ER positive -also discussed option of antihormone therapy-like tamoxifen to cut down the risk of development development of ipsilateral/contralateral invasive/noninvasive breast cancer.    #Clinical trial: Discussed the Comet trial available at the cancer center for DCIS patients.  However, given high-grade DCIS-patient is not a candidate for Comet trial.    # Genetics:mother- breast cancer @ 30s; one brother.  Discussed with patient and family regarding potential etiologies of breast cancer-sporadic versus genetics.   Patient would benefit from genetic counseling.  #At the lengthy discussion patient states that she is looking for holistic approach to treat her DCIS, and seeking complementary medicine evaluation.  Although I strongly believe complementary medicine, unfortunately we do not have complementary medicine department to help alleviate/manage her concerns.  I would recommend evaluation at tertiary care center/[facility] or [facility] [place] for further recommendations/treatment.  Patient was advised to call us if she had any concerns.   Thank you Dr.Dewey for allowing me to participate in the care of your pleasant patient. Please do not hesitate to contact me with questions or concerns in the interim.  # DISPOSITION: # follow up TBD-Dr.[email or web address]

## 2021-03-18 NOTE — Progress Notes (Signed)
[facility] OFFICE PROGRESS NOTE  Patient Care Team: Fanny Bien, MD as PCP - General (Family Medicine)  Cancer Staging No matching staging information was found for the patient.   Oncology History Overview Note  IMPRESSION: 1. There is a new indeterminate 1.9 cm group of amorphous calcifications in the lower slightly outer far posterior left breast.   2. Approximately 1 cm anterior to the calcifications, there is a 1.1 cm mass, which may represent a cyst. A solid mass cannot be excluded.   3.  No evidence of left axillary lymphadenopathy. June/AUG 2022- 1. Ultrasound-guided aspiration is recommended for the left breast mass at 6:30. If this mass does not aspirate, the procedure should be converted to biopsy.   2. Stereotactic biopsy of the left breast calcifications is recommended.PATHOLOGY revealed: A. BREAST, LEFT LOWER OUTER QUADRANT, POSTERIOR; STEREOTACTIC CORE NEEDLE BIOPSY: - DUCTAL CARCINOMA IN SITU, HIGH-GRADE WITH COMEDONECROSIS, FOCALLY INVOLVING A SCLEROSED INTRADUCTAL PAPILLOMA. - CALCIFICATIONS ASSOCIATED WITH NEOPLASTIC MAMMARY ELEMENTS. - NO DEFINITIVE EVIDENCE OF INVASIVE CARCINOMA. Comment: DCIS is present across multiple cores in 6 of 7 submitted tissue blocks, and spans at least 10 mm in greatest linear extent.   Pathology results are CONCORDANT with imaging findings  DIAGNOSIS:  A. BREAST, LEFT LOWER OUTER QUADRANT, POSTERIOR; STEREOTACTIC CORE  NEEDLE BIOPSY:  - DUCTAL CARCINOMA IN SITU, HIGH-GRADE WITH COMEDONECROSIS, FOCALLY  INVOLVING A SCLEROSED INTRADUCTAL PAPILLOMA.  - CALCIFICATIONS ASSOCIATED WITH NEOPLASTIC MAMMARY ELEMENTS.  - NO DEFINITIVE EVIDENCE OF INVASIVE CARCINOMA   Ductal carcinoma in situ (DCIS) of left breast  03/18/2021 Initial Diagnosis   Ductal carcinoma in situ (DCIS) of left breast       INTERVAL HISTORY:  Brandi Fox 50 y.o.  female pleasant female patient above history no prior  history of breast cancer/or malignancies has been referred to [place] for further evaluation recommendations for new diagnosis of DCIS.   Patient had a routine screening mammogram which was abnormal; which led to diagnostic mammogram ultrasound.  Subsequent biopsy/pathology as above   Patient notes a discoloration of the left breast post biopsy.  Otherwise denies any unusual new lumps or bumps.  Review of Systems  Constitutional:  Negative for chills, diaphoresis, fever, malaise/fatigue and weight loss.  HENT:  Negative for nosebleeds and sore throat.   Eyes:  Negative for double vision.  Respiratory:  Negative for cough, hemoptysis, sputum production, shortness of breath and wheezing.   Cardiovascular:  Negative for chest pain, palpitations, orthopnea and leg swelling.  Gastrointestinal:  Negative for abdominal pain, blood in stool, constipation, diarrhea, heartburn, melena, nausea and vomiting.  Genitourinary:  Negative for dysuria, frequency and urgency.  Musculoskeletal:  Negative for back pain and joint pain.  Skin: Negative.  Negative for itching and rash.  Neurological:  Negative for dizziness, tingling, focal weakness, weakness and headaches.  Endo/Heme/Allergies:  Does not bruise/bleed easily.  Psychiatric/Behavioral:  Negative for depression. The patient is not nervous/anxious and does not have insomnia.      PAST MEDICAL HISTORY :  Past Medical History:  Diagnosis Date   Cataracts, bilateral    DCIS (ductal carcinoma in situ) of breast    Ductal carcinoma in situ (DCIS) of left breast 03/18/2021   Juvenile glaucoma    Pseudophakia of right eye     PAST SURGICAL HISTORY :   Past Surgical History:  Procedure Laterality Date   BREAST BIOPSY Left 03/14/2021   Stereo bx, Ribbon Clip, Path pending   BREAST BIOPSY Left 03/14/2021  [place] Bx, Path pending   EYE SURGERY      FAMILY HISTORY :   Family History  Problem Relation Age of Onset   Breast cancer Mother 76   Hypertension  Mother    Hypertension Father    Glaucoma Paternal Grandfather     SOCIAL HISTORY:   Social History   Tobacco Use   Smoking status: Never   Smokeless tobacco: Never  Vaping Use   Vaping Use: Never used  Substance Use Topics   Alcohol use: No   Drug use: Never    ALLERGIES:  is allergic to amoxicillin, latex, and penicillins.  MEDICATIONS:  Current Outpatient Medications  Medication Sig Dispense Refill   brimonidine (ALPHAGAN) 0.2 % ophthalmic solution Place 1 drop into both eyes 2 (two) times daily.     latanoprost (XALATAN) 0.005 % ophthalmic solution Place 1 drop into both eyes at bedtime.     Multiple Vitamin (MULTIVITAMIN WITH MINERALS) TABS tablet Take 1 tablet by mouth daily.     timolol (TIMOPTIC) 0.5 % ophthalmic solution Place 1 drop into both eyes 2 (two) times daily.     No current facility-administered medications for this visit.    PHYSICAL EXAMINATION: ECOG PERFORMANCE STATUS: 0 - Asymptomatic  BP 121/63 (BP Location: Left Arm, Patient Position: Sitting)   Pulse 94   Temp 98.7 F (37.1 C) (Tympanic)   Resp 16   Wt 204 lb 3.2 oz (92.6 kg)   BMI 36.17 kg/m   Filed Weights   03/18/21 1431  Weight: 204 lb 3.2 oz (92.6 kg)    Physical Exam Vitals and nursing note reviewed.  Constitutional:      Comments: Accompanied by her husband.  Ambulating independently.  HENT:     Head: Normocephalic and atraumatic.     Mouth/Throat:     Pharynx: Oropharynx is clear.  Eyes:     Extraocular Movements: Extraocular movements intact.     Pupils: Pupils are equal, round, and reactive to light.  Cardiovascular:     Rate and Rhythm: Normal rate and regular rhythm.  Pulmonary:     Comments: Decreased breath sounds bilaterally.  Abdominal:     Palpations: Abdomen is soft.  Musculoskeletal:        General: Normal range of motion.     Cervical back: Normal range of motion.  Skin:    General: Skin is warm.  Neurological:     General: No focal deficit present.      Mental Status: She is alert and oriented to person, place, and time.  Psychiatric:        Behavior: Behavior normal.        Judgment: Judgment normal.       LABORATORY DATA:  I have reviewed the data as listed    Component Value Date/Time   NA 138 06/05/2016 1034   K 3.9 06/05/2016 1034   CL 106 06/05/2016 1034   CO2 26 06/05/2016 1034   GLUCOSE 86 06/05/2016 1034   BUN 10 06/05/2016 1034   CREATININE 0.69 06/05/2016 1034   CALCIUM 9.5 06/05/2016 1034   GFRNONAA >60 06/05/2016 1034   GFRAA >60 06/05/2016 1034    No results found for: SPEP, UPEP  Lab Results  Component Value Date   WBC 5.7 06/05/2016   HGB 12.7 06/05/2016   HCT 39.9 06/05/2016   MCV 80.6 06/05/2016   PLT 284 06/05/2016      Chemistry      Component Value Date/Time   NA 138  06/05/2016 1034   K 3.9 06/05/2016 1034   CL 106 06/05/2016 1034   CO2 26 06/05/2016 1034   BUN 10 06/05/2016 1034   CREATININE 0.69 06/05/2016 1034      Component Value Date/Time   CALCIUM 9.5 06/05/2016 1034       RADIOGRAPHIC STUDIES: I have personally reviewed the radiological images as listed and agreed with the findings in the report. No results found.   ASSESSMENT & PLAN:  Ductal carcinoma in situ (DCIS) of left breast #Left breast DCIS; high-grade with comedonecrosis; ER: Pending.  Agree with recommendation for MRI to further evaluate extent of DCIS.   # I had a long discussion with patient regarding pathology/natural history of DCIS.  Discussed that goal of DCIS is cure; and adjuvant therapies for prevention of new invasive/noninvasive malignancy.   #However the standard treatment-will be lumpectomy followed by adjuvant radiation.  I also discussed the rationale for sentinel lymph node biopsy given the high-grade DCIS/brain necrosis [and the fact that she could harbor-invasive cancer]. Also discussed regarding radiation therapy post lumpectomy.    # If the patient is ER positive -also discussed option of  antihormone therapy-like tamoxifen to cut down the risk of development development of ipsilateral/contralateral invasive/noninvasive breast cancer.    #Clinical trial: Discussed the Comet trial available at the cancer center for DCIS patients.  However, given high-grade DCIS-patient is not a candidate for Comet trial.    # Genetics:mother- breast cancer @ 21s; one brother.  Discussed with patient and family regarding potential etiologies of breast cancer-sporadic versus genetics.   Patient would benefit from genetic counseling.  #At the lengthy discussion patient states that she is looking for holistic approach to treat her DCIS, and seeking complementary medicine evaluation.  Although I strongly believe complementary medicine, unfortunately we do not have complementary medicine department to help alleviate/manage her concerns.  I would recommend evaluation at tertiary care center/[facility] or [facility] [place] for further recommendations/treatment.  Patient was advised to call us if she had any concerns.   Thank you Dr.Dewey for allowing me to participate in the care of your pleasant patient. Please do not hesitate to contact me with questions or concerns in the interim.  # DISPOSITION: # follow up TBD-Dr.B   No orders of the defined types were placed in this encounter.  All questions were answered. The patient knows to call the clinic with any problems, questions or concerns.      Cammie Sickle, MD 03/18/2021 5:32 PM

## 2021-03-23 ENCOUNTER — Other Ambulatory Visit
Admission: RE | Admit: 2021-03-23 | Discharge: 2021-03-23 | Disposition: A | Discharge status: Home / Self Care | Payer: Managed Care, Other (non HMO) | Source: Ambulatory Visit | Attending: General Surgery | Admitting: General Surgery

## 2021-03-23 ENCOUNTER — Ambulatory Visit: Payer: Self-pay | Admitting: General Surgery

## 2021-03-23 HISTORY — DX: Anemia, unspecified: D64.9

## 2021-03-23 HISTORY — DX: Family history of other specified conditions: Z84.89

## 2021-03-23 NOTE — Patient Instructions (Addendum)
Your procedure is scheduled on: 03/30/21 - Wednesday Report to the Registration Desk on the 1st floor of the [facility]. To find out your arrival time, please call [phone] between 1PM - 3PM on: 03/29/21 - Tuesday  REMEMBER: Instructions that are not followed completely may result in serious medical risk, up to and including death; or upon the discretion of your surgeon and anesthesiologist your surgery may need to be rescheduled.  Do not eat food after midnight the night before surgery.  No gum chewing, lozengers or hard candies.  You may however, drink CLEAR liquids up to 2 hours before you are scheduled to arrive for your surgery. Do not drink anything within 2 hours of your scheduled arrival time.  Clear liquids include: - water  - apple juice without pulp - gatorade (not RED, PURPLE, OR BLUE) - black coffee or tea (Do NOT add milk or creamers to the coffee or tea) Do NOT drink anything that is not on this list.  TAKE THESE MEDICATIONS THE MORNING OF SURGERY WITH A SIP OF WATER:  - brimonidine (ALPHAGAN) 0.2 % ophthalmic solution - brimonidine-timolol (COMBIGAN) 0.2-0.5 % ophthalmic solution  One week prior to surgery beginning 03/23/21: Stop Anti-inflammatories (NSAIDS) such as Advil, Aleve, Ibuprofen, Motrin, Naproxen, Naprosyn and Aspirin based products such as Excedrin, Goodys Powder, BC Powder.  Stop ANY OVER THE COUNTER supplements until after surgery beginning 03/23/21.  You may take Tylenol as directed if needed for pain up until the day of surgery.  No Alcohol for 24 hours before or after surgery.  No Smoking including e-cigarettes for 24 hours prior to surgery.  No chewable tobacco products for at least 6 hours prior to surgery.  No nicotine patches on the day of surgery.  Do not use any "recreational" drugs for at least a week prior to your surgery.  Please be advised that the combination of cocaine and anesthesia may have negative outcomes, up to and  including death. If you test positive for cocaine, your surgery will be cancelled.  On the morning of surgery brush your teeth with toothpaste and water, you may rinse your mouth with mouthwash if you wish. Do not swallow any toothpaste or mouthwash.  Do not wear jewelry, make-up, hairpins, clips or nail polish.  Do not wear lotions, powders, or perfumes.   Do not shave body from the neck down 48 hours prior to surgery just in case you cut yourself which could leave a site for infection.  Also, freshly shaved skin may become irritated if using the CHG soap.  Contact lenses, hearing aids and dentures may not be worn into surgery.  Do not bring valuables to the hospital. [facility] is not responsible for any missing/lost belongings or valuables.   Notify your doctor if there is any change in your medical condition (cold, fever, infection).  Wear comfortable clothing (specific to your surgery type) to the hospital.  After surgery, you can help prevent lung complications by doing breathing exercises.  Take deep breaths and cough every 1-2 hours. Your doctor may order a device called an Incentive Spirometer to help you take deep breaths. When coughing or sneezing, hold a pillow firmly against your incision with both hands. This is called "splinting." Doing this helps protect your incision. It also decreases belly discomfort.  If you are being admitted to the hospital overnight, leave your suitcase in the car. After surgery it may be brought to your room.  If you are being discharged the day of  surgery, you will not be allowed to drive home. You will need a responsible adult (18 years or older) to drive you home and stay with you that night.   If you are taking public transportation, you will need to have a responsible adult (18 years or older) with you. Please confirm with your physician that it is acceptable to use public transportation.   Please call the [facility] Dept.  at [phone] if you have any questions about these instructions.  Surgery Visitation Policy:  Patients undergoing a surgery or procedure may have one family member or support person with them as long as that person is not COVID-19 positive or experiencing its symptoms.  That person may remain in the waiting area during the procedure.  Inpatient Visitation:    Visiting hours are 7 a.m. to 8 p.m. Inpatients will be allowed two visitors daily. The visitors may change each day during the patient's stay. No visitors under the age of 51. Any visitor under the age of 26 must be accompanied by an adult. The visitor must pass COVID-19 screenings, use hand sanitizer when entering and exiting the patient's room and wear a mask at all times, including in the patient's room. Patients must also wear a mask when staff or their visitor are in the room. Masking is required regardless of vaccination status.

## 2021-03-24 ENCOUNTER — Other Ambulatory Visit: Payer: Self-pay

## 2021-03-24 ENCOUNTER — Ambulatory Visit
Admission: RE | Admit: 2021-03-24 | Discharge: 2021-03-24 | Disposition: A | Discharge status: Home / Self Care | Payer: Managed Care, Other (non HMO) | Source: Ambulatory Visit | Attending: General Surgery | Admitting: General Surgery

## 2021-03-24 DIAGNOSIS — D0512 Intraductal carcinoma in situ of left breast: Secondary | ICD-10-CM | POA: Insufficient documentation

## 2021-03-24 MED ORDER — GADOBUTROL 1 MMOL/ML IV SOLN
9.0000 mL | Freq: Once | INTRAVENOUS | Status: AC | PRN
  Administered 2021-03-24: 9 mL via INTRAVENOUS

## 2021-03-25 ENCOUNTER — Other Ambulatory Visit: Payer: Self-pay | Admitting: General Surgery

## 2021-03-25 DIAGNOSIS — D0512 Intraductal carcinoma in situ of left breast: Secondary | ICD-10-CM

## 2021-03-29 ENCOUNTER — Other Ambulatory Visit: Payer: Self-pay | Admitting: General Surgery

## 2021-03-29 DIAGNOSIS — D0512 Intraductal carcinoma in situ of left breast: Secondary | ICD-10-CM

## 2021-03-29 MED ORDER — ORAL CARE MOUTH RINSE: 15.0000 mL | Freq: Once | OROMUCOSAL | Status: AC

## 2021-03-29 MED ORDER — FAMOTIDINE 20 MG PO TABS: 20.0000 mg | ORAL_TABLET | Freq: Once | ORAL | Status: DC

## 2021-03-29 MED ORDER — CHLORHEXIDINE GLUCONATE 0.12 % MT SOLN: 15.0000 mL | Freq: Once | OROMUCOSAL | Status: AC

## 2021-03-29 MED ORDER — CEFAZOLIN SODIUM-DEXTROSE 2-4 GM/100ML-% IV SOLN
2.0000 g | INTRAVENOUS | Status: AC
  Administered 2021-03-30: 2 g via INTRAVENOUS

## 2021-03-29 MED ORDER — LACTATED RINGERS IV SOLN: INTRAVENOUS | Status: DC

## 2021-03-30 ENCOUNTER — Encounter
Admission: RE | Disposition: A | Discharge status: Home / Self Care | Payer: Self-pay | Source: Home / Self Care | Attending: General Surgery

## 2021-03-30 ENCOUNTER — Other Ambulatory Visit: Payer: Managed Care, Other (non HMO)

## 2021-03-30 ENCOUNTER — Ambulatory Visit: Payer: Managed Care, Other (non HMO) | Admitting: Anesthesiology

## 2021-03-30 ENCOUNTER — Other Ambulatory Visit: Payer: Self-pay

## 2021-03-30 ENCOUNTER — Ambulatory Visit
Admission: RE | Admit: 2021-03-30 | Discharge: 2021-03-30 | Disposition: A | Discharge status: Home / Self Care | Payer: Managed Care, Other (non HMO) | Source: Ambulatory Visit | Attending: General Surgery | Admitting: General Surgery

## 2021-03-30 ENCOUNTER — Encounter: Payer: Self-pay | Admitting: General Surgery

## 2021-03-30 ENCOUNTER — Ambulatory Visit
Admission: RE | Admit: 2021-03-30 | Discharge: 2021-03-30 | Disposition: A | Discharge status: Home / Self Care | Payer: Managed Care, Other (non HMO) | Attending: General Surgery | Admitting: General Surgery

## 2021-03-30 DIAGNOSIS — D0512 Intraductal carcinoma in situ of left breast: Secondary | ICD-10-CM | POA: Insufficient documentation

## 2021-03-30 DIAGNOSIS — Z91040 Latex allergy status: Secondary | ICD-10-CM | POA: Insufficient documentation

## 2021-03-30 DIAGNOSIS — Z79899 Other long term (current) drug therapy: Secondary | ICD-10-CM | POA: Diagnosis not present

## 2021-03-30 DIAGNOSIS — Z880 Allergy status to penicillin: Secondary | ICD-10-CM | POA: Diagnosis not present

## 2021-03-30 HISTORY — PX: BREAST LUMPECTOMY: SHX2

## 2021-03-30 LAB — POCT PREGNANCY, URINE: Preg Test, Ur: NEGATIVE

## 2021-03-30 SURGERY — PARTIAL MASTECTOMY WITH RADIO FREQUENCY LOCALIZER
Anesthesia: General | Site: Breast | Laterality: Left

## 2021-03-30 MED ORDER — GLYCOPYRROLATE 0.2 MG/ML IJ SOLN
INTRAMUSCULAR | Status: DC | PRN
  Administered 2021-03-30: .2 mg via INTRAVENOUS

## 2021-03-30 MED ORDER — ONDANSETRON HCL 4 MG/2ML IJ SOLN
INTRAMUSCULAR | Status: AC
  Filled 2021-03-30: qty 2

## 2021-03-30 MED ORDER — FENTANYL CITRATE (PF) 100 MCG/2ML IJ SOLN
INTRAMUSCULAR | Status: AC
  Filled 2021-03-30: qty 2

## 2021-03-30 MED ORDER — IPRATROPIUM-ALBUTEROL 0.5-2.5 (3) MG/3ML IN SOLN: 3.0000 mL | Freq: Once | RESPIRATORY_TRACT | Status: AC

## 2021-03-30 MED ORDER — BUPIVACAINE-EPINEPHRINE (PF) 0.5% -1:200000 IJ SOLN
INTRAMUSCULAR | Status: DC | PRN
  Administered 2021-03-30: 30 mL

## 2021-03-30 MED ORDER — FAMOTIDINE 20 MG PO TABS
ORAL_TABLET | ORAL | Status: AC
  Filled 2021-03-30: qty 1

## 2021-03-30 MED ORDER — FENTANYL CITRATE (PF) 100 MCG/2ML IJ SOLN: 25.0000 ug | INTRAMUSCULAR | Status: DC | PRN

## 2021-03-30 MED ORDER — DEXAMETHASONE SODIUM PHOSPHATE 10 MG/ML IJ SOLN
INTRAMUSCULAR | Status: DC | PRN
  Administered 2021-03-30: 10 mg via INTRAVENOUS

## 2021-03-30 MED ORDER — GLYCOPYRROLATE 0.2 MG/ML IJ SOLN
INTRAMUSCULAR | Status: AC
  Filled 2021-03-30: qty 1

## 2021-03-30 MED ORDER — MIDAZOLAM HCL 2 MG/2ML IJ SOLN
INTRAMUSCULAR | Status: DC | PRN
Start: 2021-03-30 — End: 2021-03-30
  Administered 2021-03-30: 2 mg via INTRAVENOUS

## 2021-03-30 MED ORDER — FENTANYL CITRATE (PF) 100 MCG/2ML IJ SOLN
INTRAMUSCULAR | Status: DC | PRN
  Administered 2021-03-30: 50 ug via INTRAVENOUS
  Administered 2021-03-30: 25 ug via INTRAVENOUS

## 2021-03-30 MED ORDER — STERILE WATER FOR IRRIGATION IR SOLN
Status: DC | PRN
  Administered 2021-03-30: 500 mL

## 2021-03-30 MED ORDER — MIDAZOLAM HCL 2 MG/2ML IJ SOLN
INTRAMUSCULAR | Status: AC
  Filled 2021-03-30: qty 2

## 2021-03-30 MED ORDER — IPRATROPIUM-ALBUTEROL 0.5-2.5 (3) MG/3ML IN SOLN
RESPIRATORY_TRACT | Status: AC
  Administered 2021-03-30: 3 mL
  Filled 2021-03-30: qty 3

## 2021-03-30 MED ORDER — ONDANSETRON HCL 4 MG/2ML IJ SOLN
INTRAMUSCULAR | Status: DC | PRN
  Administered 2021-03-30: 4 mg via INTRAVENOUS

## 2021-03-30 MED ORDER — CEFAZOLIN SODIUM-DEXTROSE 2-4 GM/100ML-% IV SOLN
INTRAVENOUS | Status: AC
  Filled 2021-03-30: qty 100

## 2021-03-30 MED ORDER — LIDOCAINE HCL (CARDIAC) PF 100 MG/5ML IV SOSY
PREFILLED_SYRINGE | INTRAVENOUS | Status: DC | PRN
  Administered 2021-03-30: 100 mg via INTRAVENOUS

## 2021-03-30 MED ORDER — ACETAMINOPHEN 10 MG/ML IV SOLN
INTRAVENOUS | Status: DC | PRN
  Administered 2021-03-30: 1000 mg via INTRAVENOUS

## 2021-03-30 MED ORDER — DEXAMETHASONE SODIUM PHOSPHATE 10 MG/ML IJ SOLN
INTRAMUSCULAR | Status: AC
  Filled 2021-03-30: qty 1

## 2021-03-30 MED ORDER — ACETAMINOPHEN 10 MG/ML IV SOLN
INTRAVENOUS | Status: AC
  Filled 2021-03-30: qty 100

## 2021-03-30 MED ORDER — PROMETHAZINE HCL 25 MG/ML IJ SOLN: 6.2500 mg | INTRAMUSCULAR | Status: DC | PRN

## 2021-03-30 MED ORDER — HYDROCODONE-ACETAMINOPHEN 5-325 MG PO TABS: 1.0000 | ORAL_TABLET | ORAL | 0 refills | Status: AC | PRN

## 2021-03-30 MED ORDER — SEVOFLURANE IN SOLN
RESPIRATORY_TRACT | Status: AC
  Filled 2021-03-30: qty 250

## 2021-03-30 MED ORDER — CHLORHEXIDINE GLUCONATE 0.12 % MT SOLN
OROMUCOSAL | Status: AC
  Administered 2021-03-30: 15 mL via OROMUCOSAL
  Filled 2021-03-30: qty 15

## 2021-03-30 MED ORDER — PROPOFOL 10 MG/ML IV BOLUS
INTRAVENOUS | Status: DC | PRN
  Administered 2021-03-30: 120 mg via INTRAVENOUS

## 2021-03-30 MED ORDER — LIDOCAINE HCL (PF) 2 % IJ SOLN
INTRAMUSCULAR | Status: AC
  Filled 2021-03-30: qty 5

## 2021-03-30 SURGICAL SUPPLY — 44 items
ADH SKN CLS APL DERMABOND .7 (GAUZE/BANDAGES/DRESSINGS) ×1
APL PRP STRL LF DISP 70% ISPRP (MISCELLANEOUS) ×1
BLADE SURG 15 STRL LF DISP TIS (BLADE) ×1 IMPLANT
BLADE SURG 15 STRL SS (BLADE) ×2
CHLORAPREP W/TINT 26 (MISCELLANEOUS) ×2 IMPLANT
CNTNR SPEC 2.5X3XGRAD LEK (MISCELLANEOUS)
CONT SPEC 4OZ STER OR WHT (MISCELLANEOUS)
CONT SPEC 4OZ STRL OR WHT (MISCELLANEOUS)
CONTAINER SPEC 2.5X3XGRAD LEK (MISCELLANEOUS) ×1 IMPLANT
DERMABOND ADVANCED (GAUZE/BANDAGES/DRESSINGS) ×1
DERMABOND ADVANCED .7 DNX12 (GAUZE/BANDAGES/DRESSINGS) ×1 IMPLANT
DEVICE DUBIN SPECIMEN MAMMOGRA (MISCELLANEOUS) ×2 IMPLANT
DRAPE LAPAROTOMY TRNSV 106X77 (MISCELLANEOUS) ×2 IMPLANT
ELECT CAUTERY BLADE TIP 2.5 (TIP) ×2
ELECT REM PT RETURN 9FT ADLT (ELECTROSURGICAL) ×2
ELECTRODE CAUTERY BLDE TIP 2.5 (TIP) ×1 IMPLANT
ELECTRODE REM PT RTRN 9FT ADLT (ELECTROSURGICAL) ×1 IMPLANT
GAUZE 4X4 16PLY ~~LOC~~+RFID DBL (SPONGE) ×2 IMPLANT
GLOVE SURG ENC MOIS LTX SZ6.5 (GLOVE) ×3 IMPLANT
GLOVE SURG UNDER POLY LF SZ6.5 (GLOVE) ×3 IMPLANT
GOWN STRL REUS W/ TWL LRG LVL3 (GOWN DISPOSABLE) ×3 IMPLANT
GOWN STRL REUS W/TWL LRG LVL3 (GOWN DISPOSABLE) ×6
KIT MARKER MARGIN INK (KITS) ×1 IMPLANT
KIT TURNOVER KIT A (KITS) ×2 IMPLANT
LABEL OR SOLS (LABEL) ×2 IMPLANT
MANIFOLD NEPTUNE II (INSTRUMENTS) ×2 IMPLANT
MARKER MARGIN CORRECT CLIP (MARKER) ×1 IMPLANT
NDL HYPO 25X1 1.5 SAFETY (NEEDLE) ×1 IMPLANT
NEEDLE HYPO 25X1 1.5 SAFETY (NEEDLE) ×2 IMPLANT
PACK BASIN MINOR ARMC (MISCELLANEOUS) ×2 IMPLANT
RETRACTOR RING XSMALL (MISCELLANEOUS) IMPLANT
RTRCTR WOUND ALEXIS 13CM XS SH (MISCELLANEOUS)
SET LOCALIZER 20 PROBE US (MISCELLANEOUS) ×2 IMPLANT
SUT MNCRL 4-0 (SUTURE) ×2
SUT MNCRL 4-0 27XMFL (SUTURE) ×1
SUT SILK 2 0 SH (SUTURE) ×2 IMPLANT
SUT VIC AB 3-0 SH 27 (SUTURE) ×2
SUT VIC AB 3-0 SH 27X BRD (SUTURE) ×1 IMPLANT
SUTURE MNCRL 4-0 27XMF (SUTURE) ×1 IMPLANT
SYR 10ML LL (SYRINGE) ×2 IMPLANT
SYR BULB IRRIG 60ML STRL (SYRINGE) ×2 IMPLANT
TRAP NEPTUNE SPECIMEN COLLECT (MISCELLANEOUS) ×2 IMPLANT
WATER STERILE IRR 1000ML POUR (IV SOLUTION) ×2 IMPLANT
WATER STERILE IRR 500ML POUR (IV SOLUTION) ×2 IMPLANT

## 2021-03-30 NOTE — Anesthesia Procedure Notes (Signed)
Procedure Name: LMA Insertion Date/Time: 03/30/2021 1:44 PM Performed by: Aline Brochure, CRNA Pre-anesthesia Checklist: Patient identified, Patient being monitored, Timeout performed, Emergency Drugs available and Suction available Patient Re-evaluated:Patient Re-evaluated prior to induction Oxygen Delivery Method: Circle system utilized Preoxygenation: Pre-oxygenation with 100% oxygen Induction Type: IV induction Ventilation: Mask ventilation without difficulty LMA: LMA inserted Tube type: Oral Number of attempts: 1 Placement Confirmation: positive ETCO2 and breath sounds checked- equal and bilateral Tube secured with: Tape Dental Injury: Teeth and Oropharynx as per pre-operative assessment

## 2021-03-30 NOTE — Transfer of Care (Signed)
Immediate Anesthesia Transfer of Care Note  Patient: Brandi Fox  Procedure(s) Performed: PARTIAL MASTECTOMY WITH RADIO FREQUENCY LOCALIZER (Left: Breast)  Patient Location: PACU  Anesthesia Type:General  Level of Consciousness: drowsy  Airway & Oxygen Therapy: Patient Spontanous Breathing and Patient connected to face mask oxygen  Post-op Assessment: Report given to RN and Post -op Vital signs reviewed and stable  Post vital signs: Reviewed and stable  Last Vitals:  Vitals Value Taken Time  BP 118/79 03/30/21 1504  Temp    Pulse 85 03/30/21 1508  Resp 18 03/30/21 1508  SpO2 100 % 03/30/21 1508  Vitals shown include unvalidated device data.  Last Pain:  Vitals:   03/30/21 1000  TempSrc: Oral  PainSc: 0-No pain         Complications: No notable events documented.

## 2021-03-30 NOTE — Op Note (Signed)
Preoperative diagnosis: Left breast DCIS.  Postoperative diagnosis: Same.   Procedure: Left radiofrequency tag-localized partial mastectomy.                       Anesthesia: GETA  Surgeon: Dr. Windell Moment  Wound Classification: Clean  Indications: Patient is a 50 y.o. female with a nonpalpable left breast mass noted on mammography with core biopsy demonstrating DCIS requires radiofrequency tag-localized partial mastectomy for treatment.   Findings: 1. Specimen mammography shows marker and tag on specimen 2. Pathology call refers gross examination of margins was clear 3. No other palpable mass or lymph node identified.   Description of procedure: Preoperative radiofrequency tag localization was performed by radiology. Localization studies were reviewed. The patient was taken to the operating room and placed supine on the operating table, and after general anesthesia the left chest and axilla were prepped and draped in the usual sterile fashion. A time-out was completed verifying correct patient, procedure, site, positioning, and implant(s) and/or special equipment prior to beginning this procedure.  By comparing the localization studies and interrogation with Localizer device, the probable trajectory and location of the mass was visualized. A circumareolar skin incision was planned in such a way as to minimize the amount of dissection to reach the mass.  The skin incision was made. Flaps were raised and the location of the tag was confirmed with Localizer device confirmed. A 2-0 silk figure-of-eight stay suture was placed and used for retraction. Dissection was then taken down circumferentially, taking care to include the entire localizing tag and a wide margin of grossly normal tissue. The specimen and entire localizing tag were removed. The specimen was oriented and sent to radiology with the localization studies. Confirmation was received that the entire target lesion had been resected. The  wound was irrigated. Hemostasis was checked. The wound was closed with interrupted sutures of 3-0 Vicryl and a subcuticular suture of Monocryl 3-0. No attempt was made to close the dead space.   Specimen: Left Breast mass                      Complications: None  Estimated Blood Loss: 10 mL

## 2021-03-30 NOTE — H&P (Signed)
PATIENT PROFILE: Brandi Fox is a 50 y.o. female who presents to the Clinic for consultation at the request of Dr. Ernie Fox for evaluation of left breast cancer.  PCP: Brandi Bien, MD  HISTORY OF PRESENT ILLNESS: Ms. Brandi Fox reports she got a screening mammogram. Mammogram shows calcification of the left breast. Additional images were needed. She underwent diagnostic mammogram and ultrasound. The mammogram confirmed the concerning calcification spanning of 1.9 cm. Ultrasound also shows a small mass. She underwent ultrasound-guided aspiration of the mass with complete resolution consistent with a cyst. She also had a stereotactic core biopsy of the calcifications of the left breast. This showed DCIS high-grade.  Patient denies any previous breast pain, palpable masses, skin changes, nipple retraction or nipple discharge.  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  Age-related nuclear cataract of both eyes 03/21/2017    GENERAL REVIEW OF SYSTEMS:   General ROS: negative for - chills, fatigue, fever, weight gain or weight loss Allergy and Immunology ROS: negative for - hives  Hematological and Lymphatic ROS: negative for - bleeding problems or bruising, negative for palpable nodes Endocrine ROS: negative for - heat or cold intolerance, hair changes Respiratory ROS: negative for - cough, shortness of breath or wheezing Cardiovascular ROS: no chest pain or palpitations GI ROS: negative for nausea, vomiting, abdominal pain, diarrhea, constipation Musculoskeletal ROS: negative for - joint swelling or muscle pain Neurological ROS: negative for - confusion, syncope Dermatological ROS: negative for pruritus and rash Psychiatric: negative for anxiety, depression, difficulty sleeping and memory loss  MEDICATIONS: Current Outpatient  Medications  Medication Sig Dispense Refill   brimonidine (ALPHAGAN) 0.2 % ophthalmic solution Place 1 drop into both eyes 2 (two) times daily 10 mL 11   dorzolamide-timoloL (COSOPT) 22.3-6.8 mg/mL ophthalmic solution Place 1 drop into both eyes 2 (two) times daily 30 mL 11   ferrous sulfate 325 (65 FE) MG tablet Take 325 mg by mouth daily with breakfast   latanoprost (XALATAN) 0.005 % ophthalmic solution INSTILL 1 DROP INTO BOTH EYES EVERY DAY AT NIGHT 10 mL 4   multivitamin with iron-minerals (SUPER THERA VITE M) tablet Take by mouth every morning.    Saccharomyces boulardii (FLORASTOR) 250 mg capsule Take 250 mg by mouth 2 (two) times daily   prednisoLONE acetate (PRED FORTE) 1 % ophthalmic suspension Place 1 drop into the right eye 4 (four) times daily (Patient not taking: No sig reported) 5 mL 0   trimethoprim-polymyxin b (POLYTRIM) ophthalmic solution Place 1 drop into the right eye 4 (four) times daily (Patient not taking: No sig reported) 10 mL 0   No current facility-administered medications for this visit.   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   Glaucoma (increased eye pressure)   PAST SURGICAL HISTORY: Past Surgical History:  Procedure Laterality Date   EXTRACTION CATARACT EXTRACAPSULAR W/INSERTION INTRAOCULAR PROSTHESIS Right 11/19/2017  Procedure: Combined phaco/IOL/GATT OD; Surgeon: Brandi Griffes, MD; Location: [place]; Service: Ophthalmology; Laterality: Right;   GLAUCOMA EYE SURGERY  Trab OU   INSERTION AQUEOUS SHUNT Right 03/26/2017  Procedure: Right eye glaucoma drainage device placement; Surgeon: Brandi Griffes, MD; Location: [place]; Service: Ophthalmology; Laterality: Right;   TRABECULOTOMY AB EXTERNO Right 11/19/2017  Procedure: Combined phaco/IOL/GATT OD; Surgeon: Brandi Griffes,  MD; Location: [place]; Service:  Ophthalmology; Laterality: Right;    FAMILY HISTORY: Family History  Problem Relation Age of Onset   High blood pressure (Hypertension) Mother   Breast cancer Mother   High blood pressure (Hypertension) Father   No Known Problems Brother   Glaucoma Paternal Grandfather   No Known Problems Son   Anesthesia problems Neg Hx    SOCIAL HISTORY: Social History   Socioeconomic History   Marital status: Married  Tobacco Use   Smoking status: Never Smoker   Smokeless tobacco: Never Used  Scientific laboratory technician Use: Never used  Substance and Sexual Activity   Alcohol use: No  Alcohol/week: 0.0 standard drinks   Drug use: No  Social History Narrative  Works at [employer]: Vitals:  03/16/21 1318  BP: 112/69  Pulse: 74   Body mass index is 36.31 kg/m. Weight: 93 kg (205 lb)   GENERAL: Alert, active, oriented x3  HEENT: Pupils equal reactive to light. Extraocular movements are intact. Sclera clear. Palpebral conjunctiva normal red color.Pharynx clear.  NECK: Supple with no palpable mass and no adenopathy.  LUNGS: Sound clear with no rales rhonchi or wheezes.  HEART: Regular rhythm S1 and S2 without murmur.  BREAST: breasts appear normal, no suspicious masses, no skin or nipple changes or axillary nodes.  ABDOMEN: Soft and depressible, nontender with no palpable mass, no hepatomegaly.  EXTREMITIES: Well-developed well-nourished symmetrical with no dependent edema.  NEUROLOGICAL: Awake alert oriented, facial expression symmetrical, moving all extremities.  REVIEW OF DATA: I have reviewed the following data today: No visits with results within 3 Month(s) from this visit.  Latest known visit with results is:  Admission on 11/19/2017, Discharged on 11/19/2017  Component Date Value   POC hCG Quantitative 11/19/2017 <5    ASSESSMENT: Ms. Brandi Fox is a 50 y.o. female presenting for consultation for left breast DCIS.   Patient was oriented again about  the pathology results. Surgical alternatives were discussed with patient including partial vs total mastectomy. Surgical technique and post operative care was discussed with patient. Risk of surgery was discussed with patient including but not limited to: wound infection, seroma, hematoma, brachial plexopathy, mondor's disease (thrombosis of small veins of breast), chronic wound pain, breast lymphedema, altered sensation to the nipple and cosmesis among others.   As per radiology recommendation due to high-grade DCIS they recommended to proceed with bilateral breast MRI for evaluation of the extensive disease prior to surgery. I discussed with patient that she had the alternative of proceeding with partial mastectomy without MRI. I discussed with the patient that the use of MRI due to the sensitivity might delay the surgical management if any other lesions are identified. I also discussed with the patient that MRI can overestimate the size of the DCIS needing a more extensive surgery. On the other side if MRI accurately shows a more extensive disease patient can have a better information to make a decision regarding her surgical management (partial versus total mastectomy). Patient decided to proceed with MRI. Requested an MRI. With MRI result was discussed with patient to discuss final surgical decision.   Patient had MRI that shows a 3 cm area of the DCIS.  No other suspicious area of malignancy.  After long discussion with the patient she wants to proceed with partial mastectomy without sentinel node biopsy.  Ductal carcinoma in situ (DCIS) of left breast [D05.12]  PLAN: 1.  Left breast partial mastectomy   Patient and and  her husband verbalized understanding, all questions were answered, and were agreeable with the plan outlined above.   Herbert Pun, MD  Electronically signed by Herbert Pun, MD

## 2021-03-30 NOTE — Anesthesia Preprocedure Evaluation (Signed)
Anesthesia Evaluation  Patient identified by MRN, date of birth, ID band Patient awake    Reviewed: Allergy & Precautions, H&P , NPO status , Patient's Chart, lab work & pertinent test results, reviewed documented beta blocker date and time   History of Anesthesia Complications Negative for: history of anesthetic complications  Airway Mallampati: III  TM Distance: >3 FB Neck ROM: full    Dental  (+) Dental Advidsory Given, Teeth Intact, Missing   Pulmonary neg pulmonary ROS,    Pulmonary exam normal breath sounds clear to auscultation       Cardiovascular Exercise Tolerance: Good negative cardio ROS Normal cardiovascular exam Rhythm:regular Rate:Normal     Neuro/Psych negative neurological ROS  negative psych ROS   GI/Hepatic negative GI ROS, Neg liver ROS,   Endo/Other  negative endocrine ROS  Renal/GU negative Renal ROS  negative genitourinary   Musculoskeletal   Abdominal   Peds  Hematology negative hematology ROS (+)   Anesthesia Other Findings Past Medical History: No date: Anemia No date: Cataracts, bilateral No date: DCIS (ductal carcinoma in situ) of breast 03/18/2021: Ductal carcinoma in situ (DCIS) of left breast No date: Family history of adverse reaction to anesthesia     Comment:  mom- with cateract surgery took a while to wake up No date: Juvenile glaucoma No date: Pseudophakia of right eye   Reproductive/Obstetrics negative OB ROS                             Anesthesia Physical Anesthesia Plan  ASA: 2  Anesthesia Plan: General   Post-op Pain Management:    Induction: Intravenous  PONV Risk Score and Plan: 3 and Ondansetron, Dexamethasone, Midazolam, Promethazine and Treatment may vary due to age or medical condition  Airway Management Planned: LMA  Additional Equipment:   Intra-op Plan:   Post-operative Plan: Extubation in OR  Informed Consent: I have  reviewed the patients History and Physical, chart, labs and discussed the procedure including the risks, benefits and alternatives for the proposed anesthesia with the patient or authorized representative who has indicated his/her understanding and acceptance.     Dental Advisory Given  Plan Discussed with: Anesthesiologist, CRNA and Surgeon  Anesthesia Plan Comments:         Anesthesia Quick Evaluation

## 2021-03-30 NOTE — Discharge Instructions (Addendum)
  Diet: Resume home heart healthy regular diet.   Activity: Increase activity as tolerated. Llight activity and walking are encouraged. Do not drive or drink alcohol if taking narcotic pain medications.  Wound care: May shower with soapy water and pat dry (do not rub incisions), but no baths or submerging incision underwater until follow-up. (no swimming)   Medications: Resume all home medications. For mild to moderate pain: acetaminophen (Tylenol) or ibuprofen (if no kidney disease). Combining Tylenol with alcohol can substantially increase your risk of causing liver disease. Narcotic pain medications, if prescribed, can be used for severe pain, though may cause nausea, constipation, and drowsiness. Do not combine Tylenol and Norco within a 6 hour period as Norco contains Tylenol. If you do not need the narcotic pain medication, you do not need to fill the prescription.  Call office [phone]) at any time if any questions, worsening pain, fevers/chills, bleeding, drainage from incision site, or other concerns.   AMBULATORY SURGERY  DISCHARGE INSTRUCTIONS   The drugs that you were given will stay in your system until tomorrow so for the next 24 hours you should not:  Drive an automobile Make any legal decisions Drink any alcoholic beverage   You may resume regular meals tomorrow.  Today it is better to start with liquids and gradually work up to solid foods.  You may eat anything you prefer, but it is better to start with liquids, then soup and crackers, and gradually work up to solid foods.   Please notify your doctor immediately if you have any unusual bleeding, trouble breathing, redness and pain at the surgery site, drainage, fever, or pain not relieved by medication.      Please call to schedule your post-operative visit.  Additional Instructions:

## 2021-03-31 NOTE — Anesthesia Postprocedure Evaluation (Signed)
Anesthesia Post Note  Patient: Brandi Fox  Procedure(s) Performed: PARTIAL MASTECTOMY WITH RADIO FREQUENCY LOCALIZER (Left: Breast)  Patient location during evaluation: PACU Anesthesia Type: General Level of consciousness: awake and alert Pain management: pain level controlled Vital Signs Assessment: post-procedure vital signs reviewed and stable Respiratory status: spontaneous breathing, nonlabored ventilation, respiratory function stable and patient connected to nasal cannula oxygen Cardiovascular status: blood pressure returned to baseline and stable Postop Assessment: no apparent nausea or vomiting Anesthetic complications: no   No notable events documented.   Last Vitals:  Vitals:   03/30/21 1530 03/30/21 1545  BP: 133/76 137/85  Pulse: (!) 57 64  Resp: 11 12  Temp: (!) 36 C (!) 36.1 C  SpO2: 98% 100%    Last Pain:  Vitals:   03/30/21 1545  TempSrc:   PainSc: 0-No pain                 Martha Clan

## 2021-04-01 ENCOUNTER — Other Ambulatory Visit: Payer: Self-pay | Admitting: Internal Medicine

## 2021-04-05 LAB — SURGICAL PATHOLOGY

## 2021-04-06 ENCOUNTER — Encounter: Payer: Self-pay | Admitting: General Surgery

## 2021-04-06 ENCOUNTER — Other Ambulatory Visit: Payer: Self-pay

## 2021-04-12 NOTE — Progress Notes (Signed)
Phoned patient to schedule follow-up visit with Dr. Rogue Bussing. Encouraged to at least come discuss options with Dr. Rogue Bussing. Patient states she is not interested in radiation or antihormonal therapy, so there is no need to schedule appointment.    Patient left voice message declining appointment.  She has agreed to keep appointment with Dr. Windell Moment on 04/12/21.

## 2021-05-04 ENCOUNTER — Inpatient Hospital Stay: Payer: Managed Care, Other (non HMO) | Attending: Internal Medicine | Admitting: Hospice and Palliative Medicine

## 2021-05-04 ENCOUNTER — Other Ambulatory Visit: Payer: Self-pay

## 2021-05-04 DIAGNOSIS — D0512 Intraductal carcinoma in situ of left breast: Secondary | ICD-10-CM

## 2021-05-04 NOTE — Progress Notes (Signed)
Multidisciplinary Oncology Council Documentation  Brandi Fox was presented by our [facility] on 05/04/2021, which included representatives from:  Palliative Care Dietitian  Physical/Occupational Therapist Nurse Navigator Genetics Speech Therapist Social work Survivorship RN Hotel manager Research RN [employer] currently presents with history of DCIS  We reviewed previous medical and familial history, history of present illness, and recent lab results along with all available histopathologic and imaging studies. The [place] considered available treatment options and made the following recommendations/referrals:  genetics  The MOC is a meeting of clinicians from various specialty areas who evaluate and discuss patients for whom a multidisciplinary approach is being considered. Final determinations in the plan of care are those of the provider(s).   Today's extended care, comprehensive team conference, Brandi Fox was not present for the discussion and was not examined.

## 2021-05-24 ENCOUNTER — Other Ambulatory Visit: Payer: Managed Care, Other (non HMO)

## 2021-05-24 ENCOUNTER — Encounter: Payer: Managed Care, Other (non HMO) | Admitting: Licensed Clinical Social Worker

## 2021-08-03 ENCOUNTER — Inpatient Hospital Stay: Payer: Managed Care, Other (non HMO) | Admitting: Licensed Clinical Social Worker

## 2021-08-03 ENCOUNTER — Inpatient Hospital Stay: Payer: Managed Care, Other (non HMO)

## 2021-08-26 ENCOUNTER — Ambulatory Visit
Admission: RE | Admit: 2021-08-26 | Discharge: 2021-08-26 | Disposition: A | Discharge status: Home / Self Care | Payer: Managed Care, Other (non HMO) | Source: Ambulatory Visit | Attending: Medical Oncology | Admitting: Medical Oncology

## 2021-08-26 ENCOUNTER — Other Ambulatory Visit: Payer: Self-pay

## 2021-08-26 VITALS — BP 152/85 | HR 109 | Temp 98.7°F | Resp 18

## 2021-08-26 DIAGNOSIS — Z20822 Contact with and (suspected) exposure to covid-19: Secondary | ICD-10-CM | POA: Insufficient documentation

## 2021-08-26 DIAGNOSIS — R051 Acute cough: Secondary | ICD-10-CM | POA: Diagnosis present

## 2021-08-26 MED ORDER — BENZONATATE 100 MG PO CAPS: 100.0000 mg | ORAL_CAPSULE | Freq: Three times a day (TID) | ORAL | 0 refills | Status: AC

## 2021-08-26 MED ORDER — ONDANSETRON HCL 4 MG PO TABS: 4.0000 mg | ORAL_TABLET | Freq: Four times a day (QID) | ORAL | 0 refills | Status: AC

## 2021-08-26 NOTE — ED Provider Notes (Signed)
[facility]    CSN: [id number] Arrival date & time: 08/26/21  1825      History   Chief Complaint Chief Complaint  Patient presents with   Appointment    1900   Covid Exposure   Cough    HPI Brandi Fox Alysiah Suppa is a 51 y.o. female.   HPI  Cold Symptoms: Patient reports that they have had symptoms of dry cough, malaise, nausea for the past 1 days. Symptoms are stable. They deny SOB, chest pain, fever or vomiting. They have tried nothing yet for symptoms. Husband diagnosed with COVID-19 today. Wishes to have antiviral treatment.    Past Medical History:  Diagnosis Date   Anemia    Cataracts, bilateral    DCIS (ductal carcinoma in situ) of breast    Ductal carcinoma in situ (DCIS) of left breast 03/18/2021   Family history of adverse reaction to anesthesia    mom- with cateract surgery took a while to wake up   Juvenile glaucoma    Pseudophakia of right eye     Patient Active Problem List   Diagnosis Date Noted   Ductal carcinoma in situ (DCIS) of left breast 03/18/2021   Menorrhagia with irregular cycle 07/13/2020   Intramural and subserous leiomyoma of uterus 07/13/2020    Past Surgical History:  Procedure Laterality Date   BREAST BIOPSY Left 03/14/2021   Stereo bx, Ribbon Clip, DCIS   BREAST CYST ASPIRATION Left 03/14/2021   BREAST LUMPECTOMY Left 03/30/2021   DCIS   EYE SURGERY      OB History     Gravida  1   Para  1   Term  1   Preterm      AB      Living  1      SAB      IAB      Ectopic      Multiple      Live Births               Home Medications    Prior to Admission medications   Medication Sig Start Date End Date Taking? Authorizing Provider  brimonidine (ALPHAGAN) 0.2 % ophthalmic solution Place 1 drop into both eyes 2 (two) times daily.    [provider]  brimonidine-timolol (COMBIGAN) 0.2-0.5 % ophthalmic solution Place 1 drop into both eyes every 12 (twelve) hours.    [provider]  latanoprost (XALATAN) 0.005 % ophthalmic solution Place 1 drop into both eyes at bedtime.    [provider]  Multiple Vitamin (MULTIVITAMIN WITH MINERALS) TABS tablet Take 1 tablet by mouth daily.    [provider]    Family History Family History  Problem Relation Age of Onset   Breast cancer Mother 1   Hypertension Mother    Hypertension Father    Glaucoma Paternal Grandfather     Social History Social History   Tobacco Use   Smoking status: Never   Smokeless tobacco: Never  Vaping Use   Vaping Use: Never used  Substance Use Topics   Alcohol use: No   Drug use: Never     Allergies   Amoxicillin, Latex, and Penicillins   Review of Systems Review of Systems  As stated above in HPI Physical Exam Triage Vital Signs ED Triage Vitals  Enc Vitals Group     BP 08/26/21 1834 (!) 152/85     Pulse Rate 08/26/21 1834 (!) 109     Resp 08/26/21 1834  18     Temp 08/26/21 1834 98.7 F (37.1 C)     Temp Source 08/26/21 1834 Oral     SpO2 08/26/21 1834 99 %     Weight --      Height --      Head Circumference --      Peak Flow --      Pain Score 08/26/21 1835 0     Pain Loc --      Pain Edu? --      Excl. in [place]? --    No data found.  Updated Vital Signs BP (!) 152/85 (BP Location: Left Arm)    Pulse (!) 109    Temp 98.7 F (37.1 C) (Oral)    Resp 18    SpO2 99%   Physical Exam Vitals and nursing note reviewed.  Constitutional:      General: She is not in acute distress.    Appearance: Normal appearance. She is not ill-appearing, toxic-appearing or diaphoretic.  HENT:     Head: Normocephalic and atraumatic.     Right Ear: Tympanic membrane normal.     Left Ear: Tympanic membrane normal.     Nose: Congestion and rhinorrhea present.     Mouth/Throat:     Mouth: Mucous membranes are moist.     Pharynx: Oropharynx is clear. No oropharyngeal exudate or posterior oropharyngeal erythema.  Eyes:     General:        Right eye: No  discharge.        Left eye: No discharge.     Extraocular Movements: Extraocular movements intact.     Conjunctiva/sclera: Conjunctivae normal.     Pupils: Pupils are equal, round, and reactive to light.  Cardiovascular:     Rate and Rhythm: Normal rate and regular rhythm.     Heart sounds: Normal heart sounds.  Pulmonary:     Effort: Pulmonary effort is normal. No respiratory distress.     Breath sounds: Normal breath sounds. No stridor. No wheezing or rhonchi.  Abdominal:     Palpations: Abdomen is soft.  Musculoskeletal:     Cervical back: Normal range of motion and neck supple.  Lymphadenopathy:     Cervical: No cervical adenopathy.  Skin:    General: Skin is warm.     Coloration: Skin is not jaundiced.     Findings: No erythema or rash.  Neurological:     Mental Status: She is alert and oriented to person, place, and time.     UC Treatments / Results  Labs (all labs ordered are listed, but only abnormal results are displayed) Labs Reviewed  SARS CORONAVIRUS 2 (TAT 6-24 HRS)    EKG   Radiology No results found.  Procedures Procedures (including critical care time)  Medications Ordered in UC Medications - No data to display  Initial Impression / Assessment and Plan / UC Course  I have reviewed the triage vital signs and the nursing notes.  Pertinent labs & imaging results that were available during my care of the patient were reviewed by me and considered in my medical decision making (see chart for details).     New. Cough with COVID-19 exposure. Meets criteria with BMI, cancer history, age and timeframe. Treating with molnupiravir if positive. Discussed risks, benefits and common potential side effects and precautions. For now sending in [place], zofran as well to help with her symptoms. Discussed rest, hydration and oxygen monitoring. Follow up PRN.  Final Clinical Impressions(s) / UC Diagnoses  Final diagnoses:  Acute cough  Close exposure to COVID-19  virus   Discharge Instructions   None    ED Prescriptions   None    PDMP not reviewed this encounter.   Hughie Closs, [place] 08/26/21 1942

## 2021-08-26 NOTE — ED Triage Notes (Signed)
Pt reports her husband tested positive  for COVID today. Pt reports she started having cough today.

## 2021-08-27 ENCOUNTER — Telehealth: Payer: Self-pay | Admitting: Emergency Medicine

## 2021-08-27 LAB — SARS CORONAVIRUS 2 (TAT 6-24 HRS): SARS Coronavirus 2: POSITIVE — AB

## 2021-08-27 MED ORDER — MOLNUPIRAVIR EUA 200MG CAPSULE: 4.0000 | ORAL_CAPSULE | Freq: Two times a day (BID) | ORAL | 0 refills | Status: AC

## 2021-08-27 NOTE — Telephone Encounter (Signed)
Patient tested positive for COVID.  There are no recent labs available in epic.  We will send molnupiravir to [facility] here and [place] for patient's COVID-19 diagnosis.

## 2021-11-25 ENCOUNTER — Other Ambulatory Visit: Payer: Self-pay | Admitting: General Surgery

## 2021-11-25 DIAGNOSIS — Z853 Personal history of malignant neoplasm of breast: Secondary | ICD-10-CM

## 2022-01-03 ENCOUNTER — Ambulatory Visit
Admission: RE | Admit: 2022-01-03 | Discharge: 2022-01-03 | Disposition: A | Discharge status: Home / Self Care | Payer: Managed Care, Other (non HMO) | Source: Ambulatory Visit | Attending: General Surgery | Admitting: General Surgery

## 2022-01-03 ENCOUNTER — Other Ambulatory Visit: Payer: Self-pay | Admitting: General Surgery

## 2022-01-03 DIAGNOSIS — Z853 Personal history of malignant neoplasm of breast: Secondary | ICD-10-CM | POA: Diagnosis present

## 2022-01-03 DIAGNOSIS — R921 Mammographic calcification found on diagnostic imaging of breast: Secondary | ICD-10-CM

## 2022-01-03 DIAGNOSIS — R928 Other abnormal and inconclusive findings on diagnostic imaging of breast: Secondary | ICD-10-CM

## 2022-01-30 ENCOUNTER — Ambulatory Visit
Admission: RE | Admit: 2022-01-30 | Discharge: 2022-01-30 | Disposition: A | Discharge status: Home / Self Care | Payer: Managed Care, Other (non HMO) | Source: Ambulatory Visit | Attending: General Surgery | Admitting: General Surgery

## 2022-01-30 DIAGNOSIS — R928 Other abnormal and inconclusive findings on diagnostic imaging of breast: Secondary | ICD-10-CM

## 2022-01-30 DIAGNOSIS — R921 Mammographic calcification found on diagnostic imaging of breast: Secondary | ICD-10-CM | POA: Diagnosis present

## 2022-02-01 LAB — SURGICAL PATHOLOGY

## 2022-02-02 ENCOUNTER — Encounter: Payer: Self-pay | Admitting: *Deleted

## 2022-02-02 NOTE — Progress Notes (Signed)
Patient with diagnosis of recurrent left breast DCIS.   She saw Dr. Windell Moment today and she wants a 2nd opinion at [facility].   She did not want to set up appt. To see Dr. Rogue Bussing at this time.

## 2022-02-07 ENCOUNTER — Ambulatory Visit: Payer: Managed Care, Other (non HMO) | Admitting: Internal Medicine

## 2023-10-02 ENCOUNTER — Ambulatory Visit: Attending: Family Medicine | Admitting: Occupational Therapy

## 2023-10-02 DIAGNOSIS — I972 Postmastectomy lymphedema syndrome: Secondary | ICD-10-CM

## 2023-10-02 NOTE — Therapy (Signed)
 [facility] 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   Subjective Assessment - 10/02/23 1651     Subjective  I am so glad you caught it - I did not had lymphedema when I seen the therapist last year at  [facility]    Currently in Pain? No/denies                 LYMPHEDEMA/ONCOLOGY QUESTIONNAIRE - 10/02/23 0001       Right Upper Extremity Lymphedema   15 cm Proximal to Olecranon Process 37 cm    10 cm Proximal to Olecranon Process 34.5 cm    Olecranon Process 27 cm    15 cm Proximal to Ulnar Styloid Process 25 cm    10 cm Proximal to Ulnar Styloid Process 19.8 cm    Just Proximal to Ulnar Styloid Process 15.5 cm    Across Hand at [facility] 19 cm      Left Upper Extremity Lymphedema   15 cm Proximal to Olecranon Process 37.5 cm    10 cm Proximal to Olecranon Process 35 cm    Olecranon Process 28 cm    15 cm Proximal to Ulnar Styloid Process 27.5 cm    10 cm Proximal to Ulnar Styloid Process 21.5 cm    Just Proximal to Ulnar Styloid  Process 16.5 cm    Across Hand at [facility] 18.5 cm                   Pt was seen at pre assessment yesterday for [employer] exercise program  at [facility] upon which was noted that pt has L UE lymphedema  Pt report she did not had with last appt and per last therapy note on 09/20/22 - show signs of lymphedema Pt do have 2 preventive full length gauntlets -but did not wear - not fitting well   53 y.o. female - Pt underwent a mastectomy and L SLNB on 07/11/22 followed by axillary dissection on 08/01/22. Per pt 10 ln removed -  Pt also underwent chemotherapy from 03/30/22 - 06/01/22. On 09/20/22 did not demonstrate signs of lymphedema at this time but due to her personal history, is considered at risk   Pt measurements increase by 1.7 to 2.5 cm in forearm, wrist and elbow 1 cm and upper arm 0.5 cm  Pt  gauntlet to tight at upper arm and to loose at wrist and hand   Recommend for pt to get measure at [facility] for [facility] sleeve and glove - 20-30 mm Hg Pt to wear during day am to pm  And then night time fitted with isotoner glove and Tubi grip D for hand to elbow to wear night time  Will reassess pt on 17th March -                    Patient will benefit from skilled therapeutic intervention in order to improve the following deficits and impairments:           Visit Diagnosis: Post-mastectomy lymphedema syndrome    Problem List Patient Active Problem List   Diagnosis Date Noted   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 [place], [address] Phone: [phone]   Fax:  [phone]  Name: Brandi Fox MRN: [id number] Date of Birth: 11-03-1970
